# Patient Record
Sex: Male | Born: 1960 | Race: White | Hispanic: No | Marital: Married | Smoking: Never smoker
Health system: Southern US, Community
[De-identification: ages and names within clinical notes are randomized; demographics above are authoritative.]

## PROBLEM LIST (undated history)

## (undated) DIAGNOSIS — K429 Umbilical hernia without obstruction or gangrene: Secondary | ICD-10-CM

## (undated) DIAGNOSIS — E119 Type 2 diabetes mellitus without complications: Secondary | ICD-10-CM

## (undated) DIAGNOSIS — I10 Essential (primary) hypertension: Secondary | ICD-10-CM

## (undated) DIAGNOSIS — E785 Hyperlipidemia, unspecified: Secondary | ICD-10-CM

## (undated) DIAGNOSIS — M1990 Unspecified osteoarthritis, unspecified site: Secondary | ICD-10-CM

## (undated) HISTORY — PX: HERNIA REPAIR: SHX51

## (undated) HISTORY — PX: SHOULDER SURGERY: SHX246

## (undated) HISTORY — PX: LEG SURGERY: SHX1003

---

## 1990-09-29 DIAGNOSIS — I82409 Acute embolism and thrombosis of unspecified deep veins of unspecified lower extremity: Secondary | ICD-10-CM

## 1990-09-29 DIAGNOSIS — Z9289 Personal history of other medical treatment: Secondary | ICD-10-CM

## 1990-09-29 HISTORY — DX: Acute embolism and thrombosis of unspecified deep veins of unspecified lower extremity: I82.409

## 1990-09-29 HISTORY — DX: Personal history of other medical treatment: Z92.89

## 2007-11-14 ENCOUNTER — Ambulatory Visit: Payer: Self-pay | Admitting: Family Medicine

## 2008-07-13 ENCOUNTER — Ambulatory Visit: Payer: Self-pay | Admitting: Internal Medicine

## 2008-10-30 ENCOUNTER — Ambulatory Visit: Payer: Self-pay | Admitting: Internal Medicine

## 2009-03-21 ENCOUNTER — Ambulatory Visit: Payer: Self-pay | Admitting: Internal Medicine

## 2009-08-13 ENCOUNTER — Emergency Department: Payer: Self-pay | Admitting: Emergency Medicine

## 2009-08-29 ENCOUNTER — Ambulatory Visit: Payer: Self-pay | Admitting: Internal Medicine

## 2009-09-03 ENCOUNTER — Other Ambulatory Visit: Payer: Self-pay | Admitting: Internal Medicine

## 2009-09-29 ENCOUNTER — Ambulatory Visit: Payer: Self-pay | Admitting: Internal Medicine

## 2009-10-16 ENCOUNTER — Ambulatory Visit: Payer: Self-pay | Admitting: Unknown Physician Specialty

## 2009-10-30 ENCOUNTER — Ambulatory Visit: Payer: Self-pay | Admitting: Internal Medicine

## 2009-11-26 ENCOUNTER — Ambulatory Visit: Payer: Self-pay | Admitting: General Practice

## 2009-11-27 ENCOUNTER — Ambulatory Visit: Payer: Self-pay | Admitting: Internal Medicine

## 2009-12-14 ENCOUNTER — Inpatient Hospital Stay: Payer: Self-pay | Admitting: General Practice

## 2009-12-28 ENCOUNTER — Ambulatory Visit: Payer: Self-pay | Admitting: Internal Medicine

## 2010-01-27 ENCOUNTER — Ambulatory Visit: Payer: Self-pay | Admitting: Internal Medicine

## 2010-02-27 ENCOUNTER — Ambulatory Visit: Payer: Self-pay | Admitting: Internal Medicine

## 2010-03-15 ENCOUNTER — Ambulatory Visit: Payer: Self-pay | Admitting: Internal Medicine

## 2010-03-29 ENCOUNTER — Ambulatory Visit: Payer: Self-pay | Admitting: Internal Medicine

## 2010-04-29 ENCOUNTER — Ambulatory Visit: Payer: Self-pay | Admitting: Internal Medicine

## 2010-05-15 ENCOUNTER — Ambulatory Visit: Payer: Self-pay | Admitting: Internal Medicine

## 2010-05-26 IMAGING — CT CT CHEST-ABD-PELV W/ CM
1 of 2 series · 14 of 32 positions shown, 19 images · non-contrast
Comparison: none

REASON FOR EXAM: inguinal lymphadenopathy  DVT
COMMENTS:

[Series 2: soft tissue · axial · 0.86mm/px · z∈[-1194,-594]mm · 14 of 134 slices shown, 19 images]
[im 7/134  soft-tissue]
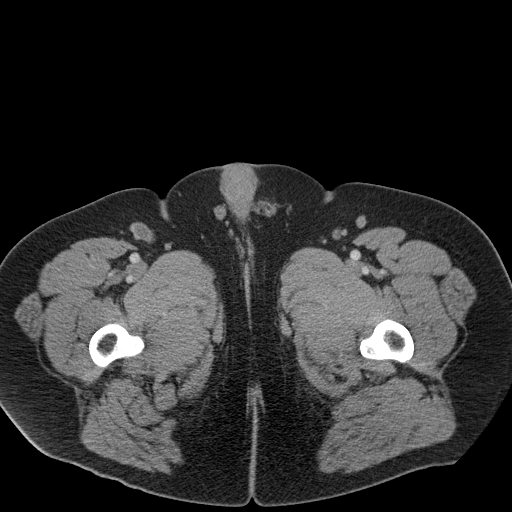
[im 7/134  bone]
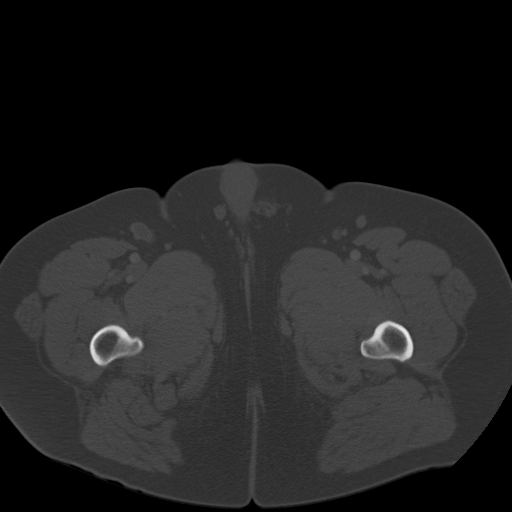
[im 20/134  soft-tissue]
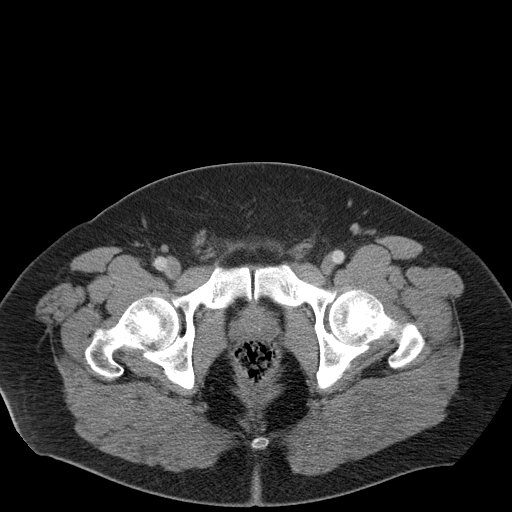
[im 26/134  soft-tissue]
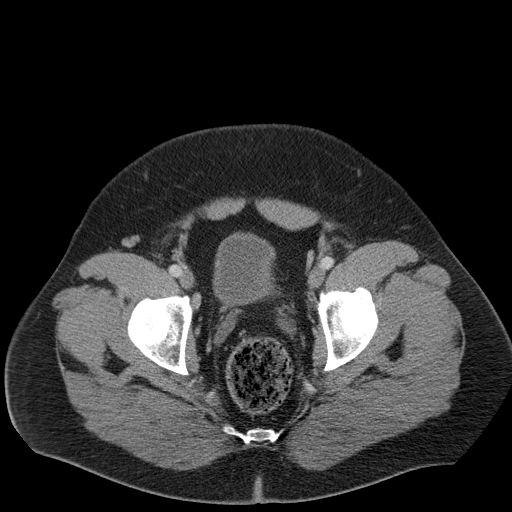
[im 39/134  soft-tissue]
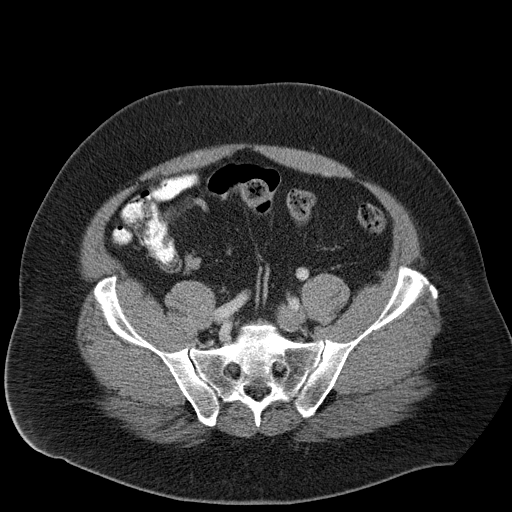
[im 45/134  soft-tissue]
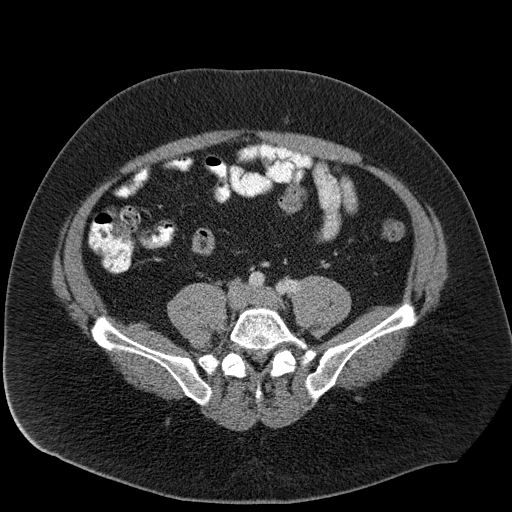
[im 58/134  soft-tissue]
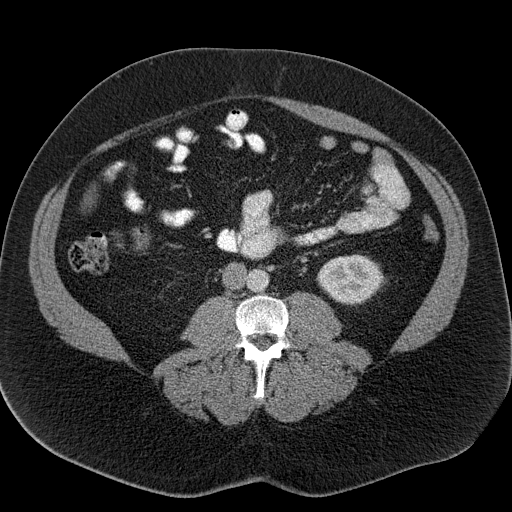
[im 70/134  soft-tissue]
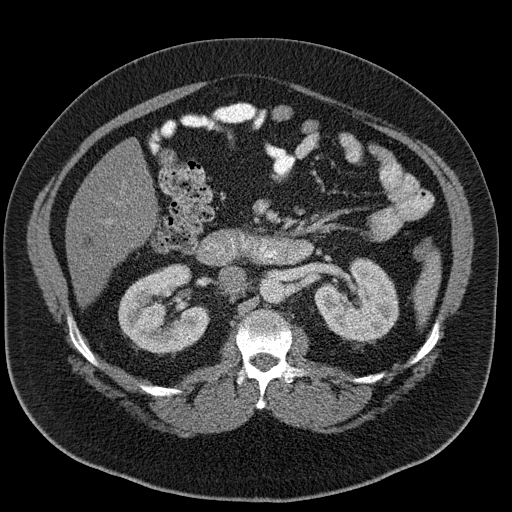
[im 77/134  soft-tissue]
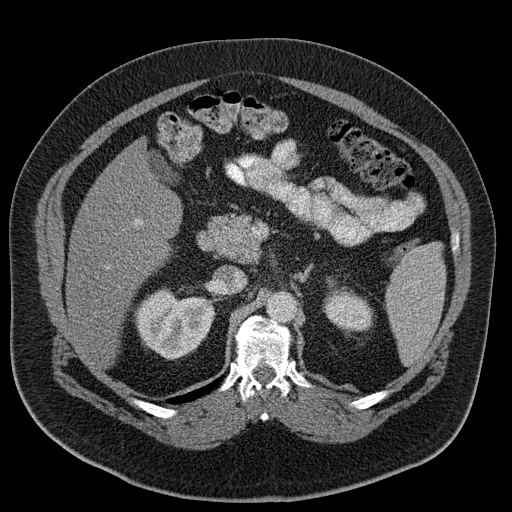
[im 89/134  soft-tissue]
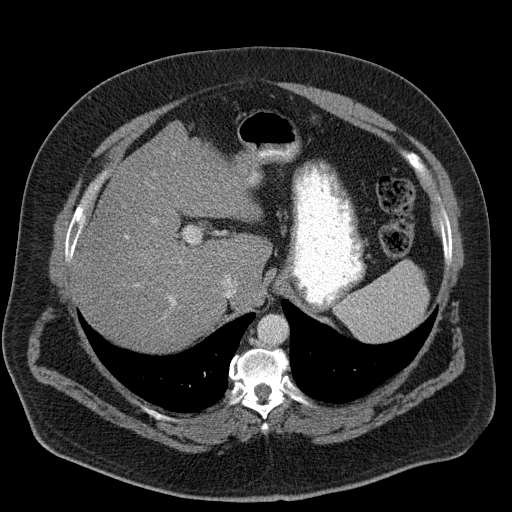
[im 89/134  bone]
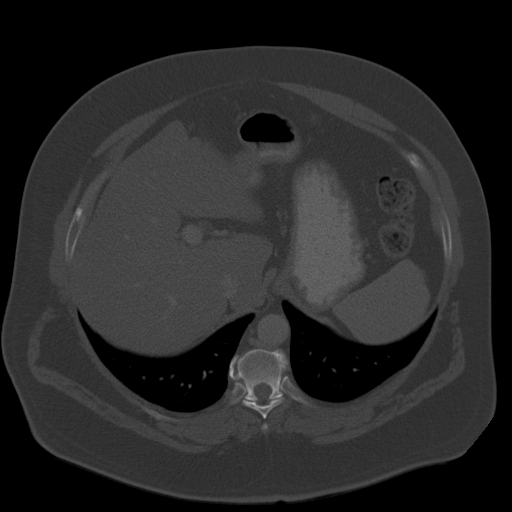
[im 96/134  soft-tissue]
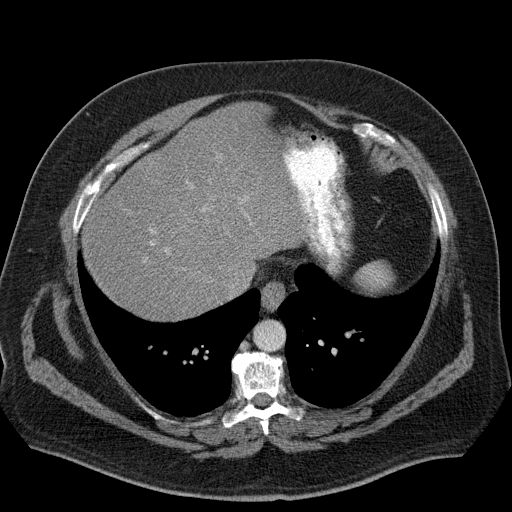
[im 108/134  soft-tissue]
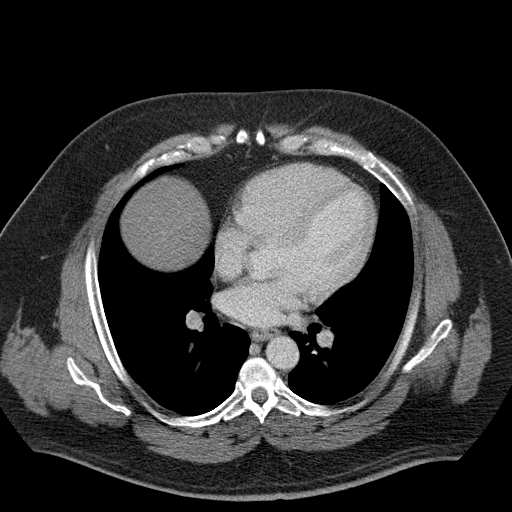
[im 108/134  lung]
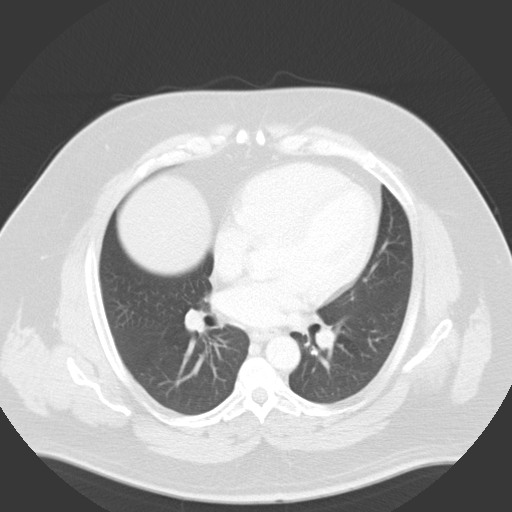
[im 115/134  soft-tissue]
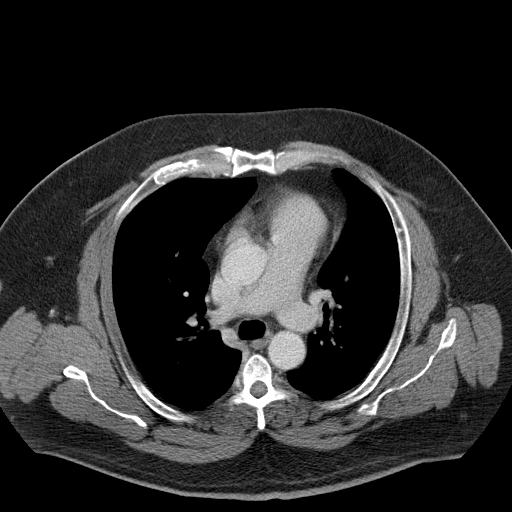
[im 115/134  lung]
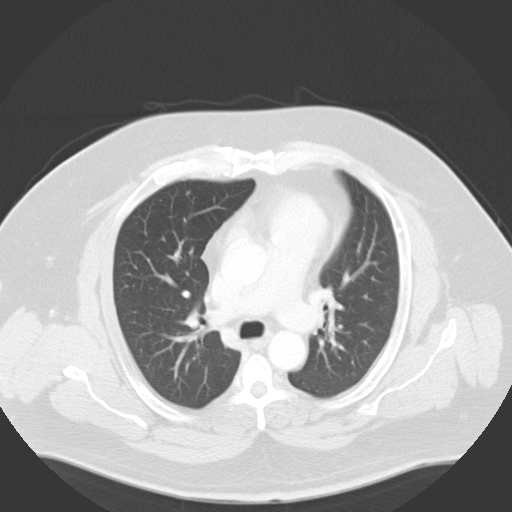
[im 121/134  lung]
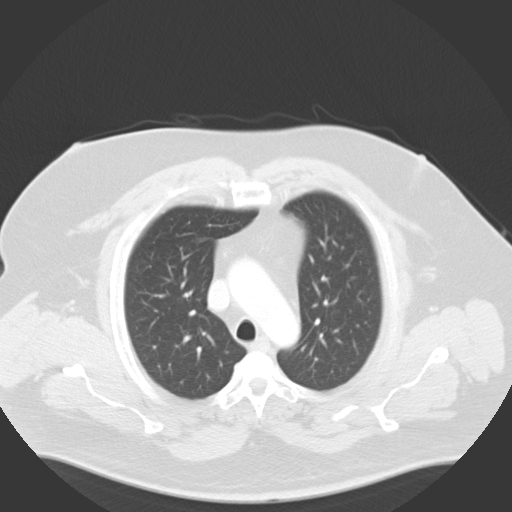
[im 127/134  soft-tissue]
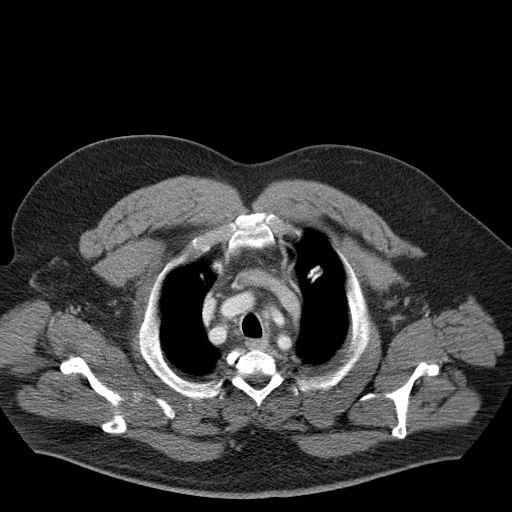
[im 127/134  lung]
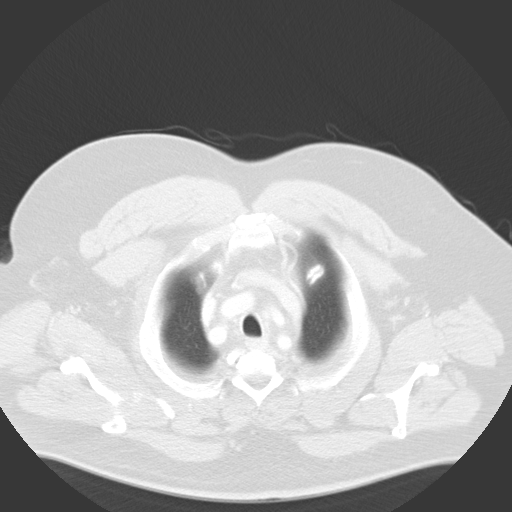

[14 of 32 positions shown; findings below may reference images not displayed]

PROCEDURE:     CT  - CT CHEST ABDOMEN AND PELVIS W  - September 04, 2009  [DATE]

RESULT:     CT of the chest, abdomen and pelvis is performed utilizing 100
ml of Msovue-S6L iodinated intravenous contrast along with oral contrast.
Images are reconstructed in the axial plane at 5 mm slice thickness. The
patient has no previous examination for comparison.

There is no evidence of supraclavicular, mediastinal, hilar or axillary mass
or adenopathy. The aorta is normal in caliber with no evidence of dissection
in the thoracic and aortic abdominal regions. There is low-attenuation of
the liver consistent with fatty infiltration. The esophagus appears
unremarkable. No central airway mass or obstruction is appreciated. There is
no focal pulmonary parenchymal nodule or mass. There is some minimal
posterior gutter atelectasis or fibrosis. This is seen on the right. There
is no evidence of retroperitoneal, mesenteric or porta hepatis adenopathy.
The spleen is not enlarged. There is no abnormal bowel distention or bowel
wall thickening. The gallbladder is present with no stones visualized. The
adrenal glands appear normal. The kidneys are unremarkable. There is no
edema. The appendix is normal. The urinary bladder is nondistended. Central
prostate calcifications are present. There is a moderate amount of fecal
material in the rectum and sigmoid region. There is no acute inflammation.
No pelvic mass is evident. No pelvic sidewall adenopathy is appreciated.
Some slightly prominent inguinal lymph nodes are seen with one on the right
measuring as much as 2.38 x 1.08 cm on image #129.
IMPRESSION: Some inguinal adenopathy is noted. Adenopathy is not
evident otherwise. There is fatty infiltration of the liver. No other
significant abnormality is seen.

## 2010-10-06 ENCOUNTER — Emergency Department: Payer: Self-pay | Admitting: Internal Medicine

## 2010-10-10 ENCOUNTER — Ambulatory Visit: Payer: Self-pay | Admitting: Family Medicine

## 2010-11-02 ENCOUNTER — Emergency Department: Payer: Self-pay | Admitting: Unknown Physician Specialty

## 2012-04-28 ENCOUNTER — Ambulatory Visit: Payer: Self-pay | Admitting: Family Medicine

## 2012-04-29 ENCOUNTER — Ambulatory Visit: Payer: Self-pay | Admitting: Family Medicine

## 2012-05-30 ENCOUNTER — Ambulatory Visit: Payer: Self-pay | Admitting: Family Medicine

## 2012-07-08 ENCOUNTER — Ambulatory Visit: Payer: Self-pay | Admitting: Family Medicine

## 2012-07-30 ENCOUNTER — Ambulatory Visit: Payer: Self-pay | Admitting: Family Medicine

## 2014-01-09 ENCOUNTER — Ambulatory Visit: Payer: Self-pay | Admitting: Physician Assistant

## 2014-09-08 ENCOUNTER — Emergency Department: Payer: Self-pay | Admitting: Student

## 2014-09-23 ENCOUNTER — Emergency Department: Payer: Self-pay | Admitting: Emergency Medicine

## 2016-08-18 ENCOUNTER — Emergency Department
Admission: EM | Admit: 2016-08-18 | Discharge: 2016-08-18 | Disposition: A | Discharge status: Home / Self Care | Payer: BC Managed Care – PPO | Attending: Emergency Medicine | Admitting: Emergency Medicine

## 2016-08-18 DIAGNOSIS — T148XXA Other injury of unspecified body region, initial encounter: Secondary | ICD-10-CM

## 2016-08-18 DIAGNOSIS — Y929 Unspecified place or not applicable: Secondary | ICD-10-CM | POA: Diagnosis not present

## 2016-08-18 DIAGNOSIS — S8991XA Unspecified injury of right lower leg, initial encounter: Secondary | ICD-10-CM | POA: Diagnosis present

## 2016-08-18 DIAGNOSIS — Y9301 Activity, walking, marching and hiking: Secondary | ICD-10-CM | POA: Diagnosis not present

## 2016-08-18 DIAGNOSIS — S86911A Strain of unspecified muscle(s) and tendon(s) at lower leg level, right leg, initial encounter: Secondary | ICD-10-CM | POA: Diagnosis not present

## 2016-08-18 DIAGNOSIS — W172XXA Fall into hole, initial encounter: Secondary | ICD-10-CM | POA: Insufficient documentation

## 2016-08-18 DIAGNOSIS — Y999 Unspecified external cause status: Secondary | ICD-10-CM | POA: Insufficient documentation

## 2016-08-18 NOTE — ED Provider Notes (Signed)
[facility] Emergency Department Provider Note  ____________________________________________   First MD Initiated Contact with Patient 08/18/16 0403     (approximate)  I have reviewed the triage vital signs and the nursing notes.   HISTORY  Chief Complaint Fall    HPI Manuel Fuller is a 55 y.o. male with hx of prior right sided knee surgery who presents with mild pain after stepping in a hole tonight while walking his dog.  States he "heard some pops" and had acute onset mild to moderate sharp pain mostly on the medial side of the knee.  No swelling.  Ambulatory without difficulty.  Thought he should get it checked out since he had surgery in the past.  Pain mild, slightly worse with ambulation but still able to walk without difficulty.  Also complains of mild dull aching pain in lower back, bilateral soft tissue at lower lumbar spine.     No past medical history on file.  There are no active problems to display for this patient.   No past surgical history on file.  Prior to Admission medications   Not on File    Allergies Patient has no known allergies.  No family history on file.  Social History Social History  Substance Use Topics  . Smoking status: Not on file  . Smokeless tobacco: Not on file  . Alcohol use Not on file    Review of Systems Constitutional: No fever/chills Eyes: No visual changes. CV:  No chest pain Gastrointestinal: No abdominal pain.  No nausea, no vomiting.  No diarrhea.   Genitourinary: Negative for dysuria. Musculoskeletal: Pain in right knee and dull ache in lower back Skin: Negative for rash.   ____________________________________________   PHYSICAL EXAM:  VITAL SIGNS: ED Triage Vitals  Enc Vitals Group     BP 08/18/16 0130 (!) 167/98     Pulse Rate 08/18/16 0130 98     Resp 08/18/16 0130 18     Temp 08/18/16 0130 98.2 F (36.8 C)     Temp Source 08/18/16 0130 Oral     SpO2 08/18/16 0130 99 %      Weight 08/18/16 0130 (!) 303 lb (137.4 kg)     Height 08/18/16 0130 5\' 7"  (1.702 m)     Head Circumference --      Peak Flow --      Pain Score 08/18/16 0131 8     Pain Loc --      Pain Edu? --      Excl. in GC? --     Constitutional: Alert and oriented. Well appearing and in no acute distress. Head: Atraumatic. Cardiovascular: Normal rate, regular rhythm. Good peripheral circulation.  Respiratory: Normal respiratory effort.  No retractions.  Musculoskeletal: No laxity of right knee joint.  No reproducible tenderness with laxity test.  No swelling around the joint.  Mild tenderness to palpation medial to the patella without effusion.  Old surgical scars are present.  No pain or tenderness with full flexion and extension of the joint.  Neurologic:  Normal speech and language. No gross focal neurologic deficits are appreciated.  Skin:  Skin is warm, dry and intact. No rash noted.  ____________________________________________   LABS (all labs ordered are listed, but only abnormal results are displayed)  Labs Reviewed - No data to display ____________________________________________  EKG  None - EKG not ordered by ED physician ____________________________________________  RADIOLOGY   No results found.  ____________________________________________   PROCEDURES  Procedure(s) performed:  Procedures   Critical Care performed: No ____________________________________________   INITIAL IMPRESSION / ASSESSMENT AND PLAN / ED COURSE  Pertinent labs & imaging results that were available during my care of the patient were reviewed by me and considered in my medical decision making (see chart for details).  The patient may have a mild strain in his right knee but has no evidence of severe injury.  There is no effusion, no swelling, no reproducible pain or tenderness with range of motion.  There is no laxity of the joint.  I explained to him that I could get x-rays but I do  not recommend that; [facility] and I encouraged him to follow up tomorrow and he is comfortable with that plan.  He is reassured by the normal physical exam and given that, as he pointed out, he can still walk on it, I think this is appropriate.  I will give him an Ace wrap and encourage close outpatient follow-up.  I explained that he should take Tylenol and ibuprofen for his low back strain.    ____________________________________________  FINAL CLINICAL IMPRESSION(S) / ED DIAGNOSES  Final diagnoses:  Knee strain, right, initial encounter  Muscle strain     MEDICATIONS GIVEN DURING THIS VISIT:  Medications - No data to display   NEW OUTPATIENT MEDICATIONS STARTED DURING THIS VISIT:  New Prescriptions   No medications on file    Modified Medications   No medications on file    Discontinued Medications   No medications on file     Note:  This document was prepared using Dragon voice recognition software and may include unintentional dictation errors.    Loleta Roseory Wilfrid Hyser, MD 08/18/16 [phone]

## 2016-08-18 NOTE — ED Notes (Addendum)
Head of bed lowered. Lights turned off per patient request

## 2016-08-18 NOTE — Discharge Instructions (Signed)

## 2016-08-18 NOTE — ED Notes (Signed)
Reviewed d/c instructions, follow-up care, use of ice/elevation, ace-bandage application and OTC pain meds with patient. Pt verbalized understanding.

## 2016-08-18 NOTE — ED Triage Notes (Signed)
Pt states that he walked his dog this afternoon, stepped in a mole hole and fell, pt is c/o lower back pain and rt knee pain
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
## 2016-12-12 ENCOUNTER — Other Ambulatory Visit: Payer: Self-pay | Admitting: Physical Medicine and Rehabilitation

## 2016-12-12 DIAGNOSIS — M5416 Radiculopathy, lumbar region: Secondary | ICD-10-CM

## 2016-12-24 ENCOUNTER — Ambulatory Visit
Admission: RE | Admit: 2016-12-24 | Discharge: 2016-12-24 | Disposition: A | Discharge status: Home / Self Care | Payer: BC Managed Care – PPO | Source: Ambulatory Visit | Attending: Physical Medicine and Rehabilitation | Admitting: Physical Medicine and Rehabilitation

## 2016-12-24 DIAGNOSIS — M4804 Spinal stenosis, thoracic region: Secondary | ICD-10-CM | POA: Insufficient documentation

## 2016-12-24 DIAGNOSIS — E882 Lipomatosis, not elsewhere classified: Secondary | ICD-10-CM | POA: Diagnosis not present

## 2016-12-24 DIAGNOSIS — M4316 Spondylolisthesis, lumbar region: Secondary | ICD-10-CM | POA: Diagnosis not present

## 2016-12-24 DIAGNOSIS — M48061 Spinal stenosis, lumbar region without neurogenic claudication: Secondary | ICD-10-CM | POA: Insufficient documentation

## 2016-12-24 DIAGNOSIS — M5416 Radiculopathy, lumbar region: Secondary | ICD-10-CM | POA: Diagnosis present

## 2016-12-24 DIAGNOSIS — M5127 Other intervertebral disc displacement, lumbosacral region: Secondary | ICD-10-CM | POA: Diagnosis not present

## 2017-11-03 ENCOUNTER — Emergency Department
Admission: EM | Admit: 2017-11-03 | Discharge: 2017-11-03 | Disposition: A | Discharge status: Home / Self Care | Payer: BC Managed Care – PPO | Attending: Emergency Medicine | Admitting: Emergency Medicine

## 2017-11-03 DIAGNOSIS — E119 Type 2 diabetes mellitus without complications: Secondary | ICD-10-CM | POA: Diagnosis not present

## 2017-11-03 DIAGNOSIS — Y929 Unspecified place or not applicable: Secondary | ICD-10-CM | POA: Diagnosis not present

## 2017-11-03 DIAGNOSIS — W57XXXA Bitten or stung by nonvenomous insect and other nonvenomous arthropods, initial encounter: Secondary | ICD-10-CM | POA: Diagnosis not present

## 2017-11-03 DIAGNOSIS — Y939 Activity, unspecified: Secondary | ICD-10-CM | POA: Diagnosis not present

## 2017-11-03 DIAGNOSIS — I10 Essential (primary) hypertension: Secondary | ICD-10-CM | POA: Insufficient documentation

## 2017-11-03 DIAGNOSIS — S20461A Insect bite (nonvenomous) of right back wall of thorax, initial encounter: Secondary | ICD-10-CM | POA: Insufficient documentation

## 2017-11-03 DIAGNOSIS — Y999 Unspecified external cause status: Secondary | ICD-10-CM | POA: Insufficient documentation

## 2017-11-03 DIAGNOSIS — S299XXA Unspecified injury of thorax, initial encounter: Secondary | ICD-10-CM | POA: Diagnosis present

## 2017-11-03 HISTORY — DX: Essential (primary) hypertension: I10

## 2017-11-03 HISTORY — DX: Hyperlipidemia, unspecified: E78.5

## 2017-11-03 HISTORY — DX: Type 2 diabetes mellitus without complications: E11.9

## 2017-11-03 MED ORDER — HYDROXYZINE HCL 50 MG PO TABS: 50.0000 mg | ORAL_TABLET | Freq: Three times a day (TID) | ORAL | 0 refills | Status: DC | PRN

## 2017-11-03 MED ORDER — HYDROXYZINE HCL 50 MG PO TABS
50.0000 mg | ORAL_TABLET | Freq: Once | ORAL | Status: AC
  Administered 2017-11-03: 50 mg via ORAL
  Filled 2017-11-03: qty 1
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
## 2017-11-03 NOTE — ED Triage Notes (Signed)
Pt reports that he was bitten by something on his back, but states that he is not sure what.  Pt reports that he went by FD before coming to ER, but was unable to tell what this was.  Pt is A&Ox4, in NAD.

## 2018-05-20 ENCOUNTER — Encounter: Payer: Self-pay | Admitting: Emergency Medicine

## 2018-05-20 ENCOUNTER — Emergency Department
Admission: EM | Admit: 2018-05-20 | Discharge: 2018-05-20 | Disposition: A | Discharge status: Home / Self Care | Payer: BC Managed Care – PPO | Attending: Emergency Medicine | Admitting: Emergency Medicine

## 2018-05-20 ENCOUNTER — Other Ambulatory Visit: Payer: Self-pay

## 2018-05-20 ENCOUNTER — Emergency Department: Payer: BC Managed Care – PPO

## 2018-05-20 DIAGNOSIS — Z79899 Other long term (current) drug therapy: Secondary | ICD-10-CM | POA: Insufficient documentation

## 2018-05-20 DIAGNOSIS — E785 Hyperlipidemia, unspecified: Secondary | ICD-10-CM | POA: Diagnosis not present

## 2018-05-20 DIAGNOSIS — R079 Chest pain, unspecified: Secondary | ICD-10-CM | POA: Insufficient documentation

## 2018-05-20 DIAGNOSIS — E119 Type 2 diabetes mellitus without complications: Secondary | ICD-10-CM | POA: Diagnosis not present

## 2018-05-20 DIAGNOSIS — I10 Essential (primary) hypertension: Secondary | ICD-10-CM | POA: Insufficient documentation

## 2018-05-20 LAB — BASIC METABOLIC PANEL
ANION GAP: 11 (ref 5–15)
BUN: 16 mg/dL (ref 6–20)
CO2: 24 mmol/L (ref 22–32)
Calcium: 8.7 mg/dL — ABNORMAL LOW (ref 8.9–10.3)
Chloride: 104 mmol/L (ref 98–111)
Creatinine, Ser: 0.87 mg/dL (ref 0.61–1.24)
GFR calc Af Amer: >60 mL/min (ref >60)
GFR calc non Af Amer: >60 mL/min (ref >60)
GLUCOSE: 118 mg/dL — AB (ref 70–99)
POTASSIUM: 3.4 mmol/L — AB (ref 3.5–5.1)
Sodium: 139 mmol/L (ref 135–145)

## 2018-05-20 LAB — CBC
HEMATOCRIT: 42.4 % (ref 40.0–52.0)
Hemoglobin: 14.3 g/dL (ref 13.0–18.0)
MCH: 30.3 pg (ref 26.0–34.0)
MCHC: 33.8 g/dL (ref 32.0–36.0)
MCV: 89.7 fL (ref 80.0–100.0)
PLATELETS: 204 10*3/uL (ref 150–440)
RBC: 4.73 MIL/uL (ref 4.40–5.90)
RDW: 13.6 % (ref 11.5–14.5)
WBC: 7.7 10*3/uL (ref 3.8–10.6)

## 2018-05-20 LAB — TROPONIN I
Troponin I: <0.03 ng/mL (ref <0.03)
Troponin I: <0.03 ng/mL (ref <0.03)

## 2018-05-20 NOTE — ED Notes (Signed)
Pt ambulatory upon discharge; declined wheel chair. Verbalized understanding of discharge instructions and follow-up care. VSS. Skin warm and dry. A&O x4.  

## 2018-05-20 NOTE — ED Provider Notes (Signed)
[facility] Emergency Department Provider Note  ____________________________________________   First MD Initiated Contact with Patient 05/20/18 [phone]     (approximate)  I have reviewed the triage vital signs and the nursing notes.   HISTORY  Chief Complaint Chest Pain   HPI Manuel Fuller is a 57 y.o. male self presents to the emergency department with several days of intermittent left upper chest pain.  The pain is sharp mild to moderate nonradiating.  It lasts several minutes at a time.  No shortness of breath.  No nausea or vomiting.  He has no coronary history although he does have a history of diabetes, hypertension, dyslipidemia.  He has no recent surgery travel or immobilization.  No leg swelling.  The pain is not ripping or tearing it does not go straight to his back.  He is concerned because his pain persists and he wanted to be checked out for a "heart attack".    Past Medical History:  Diagnosis Date  . Diabetes mellitus without complication (HCC)   . Hyperlipemia   . Hypertension     There are no active problems to display for this patient.   Past Surgical History:  Procedure Laterality Date  . HERNIA REPAIR    . LEG SURGERY    . SHOULDER SURGERY      Prior to Admission medications   Medication Sig Start Date End Date Taking? Authorizing Provider  hydrOXYzine (ATARAX/VISTARIL) 50 MG tablet Take 1 tablet (50 mg total) by mouth 3 (three) times daily as needed for itching. 11/03/17   Joni ReiningSmith, Ronald K, PA-C    Allergies Patient has no known allergies.  No family history on file.  Social History Social History   Tobacco Use  . Smoking status: Never Smoker  . Smokeless tobacco: Never Used  Substance Use Topics  . Alcohol use: No    Frequency: Never  . Drug use: No    Review of Systems Constitutional: No fever/chills Eyes: No visual changes. ENT: No sore throat. Cardiovascular: Positive for chest pain. Respiratory: Denies  shortness of breath. Gastrointestinal: No abdominal pain.  No nausea, no vomiting.  No diarrhea.  No constipation. Genitourinary: Negative for dysuria. Musculoskeletal: Negative for back pain. Skin: Negative for rash. Neurological: Negative for headaches, focal weakness or numbness.   ____________________________________________   PHYSICAL EXAM:  VITAL SIGNS: ED Triage Vitals  Enc Vitals Group     BP 05/20/18 0605 (!) 135/91     Pulse Rate 05/20/18 0605 73     Resp 05/20/18 0605 18     Temp 05/20/18 0605 98 F (36.7 C)     Temp Source 05/20/18 0605 Oral     SpO2 05/20/18 0605 99 %     Weight 05/20/18 0603 (!) 310 lb (140.6 kg)     Height 05/20/18 0603 5\' 7"  (1.702 m)     Head Circumference --      Peak Flow --      Pain Score 05/20/18 0603 0     Pain Loc --      Pain Edu? --      Excl. in GC? --     Constitutional: Alert and oriented x4 pleasant cooperative speaks full clear sentences Eyes: PERRL EOMI. Head: Atraumatic. Nose: No congestion/rhinnorhea. Mouth/Throat: No trismus Neck: No stridor.   Cardiovascular: Normal rate, regular rhythm. Grossly normal heart sounds.  Good peripheral circulation. Respiratory: Normal respiratory effort.  No retractions. Lungs CTAB and moving good air Gastrointestinal: Morbidly obese soft nontender  Musculoskeletal: No lower extremity edema   Neurologic:  Normal speech and language. No gross focal neurologic deficits are appreciated. Skin:  Skin is warm, dry and intact. No rash noted. Psychiatric: Mood and affect are normal. Speech and behavior are normal.    ____________________________________________   DIFFERENTIAL includes but not limited to  Aortic dissection, acute coronary syndrome, stable angina, pulmonary embolism ____________________________________________   LABS (all labs ordered are listed, but only abnormal results are displayed)  Labs Reviewed  BASIC METABOLIC PANEL - Abnormal; Notable for the following  components:      Result Value   Potassium 3.4 (*)    Glucose, Bld 118 (*)    Calcium 8.7 (*)    All other components within normal limits  CBC  TROPONIN I  TROPONIN I    Lab work reviewed by me with no signs of acute ischemia x2 __________________________________________  EKG  ED ECG REPORT I, Merrily Brittle, the attending physician, personally viewed and interpreted this ECG.  Date: 05/22/2018 EKG Time:  Rate: 69 Rhythm: normal sinus rhythm QRS Axis: normal Intervals: normal ST/T Wave abnormalities: normal Narrative Interpretation: no evidence of acute ischemia  ____________________________________________  RADIOLOGY  Chest x-ray reviewed by me with no acute disease ____________________________________________   PROCEDURES  Procedure(s) performed: no  Procedures  Critical Care performed: no  ____________________________________________   INITIAL IMPRESSION / ASSESSMENT AND PLAN / ED COURSE  Pertinent labs & imaging results that were available during my care of the patient were reviewed by me and considered in my medical decision making (see chart for details).   As part of my medical decision making, I reviewed the following data within the electronic MEDICAL RECORD NUMBER History obtained from family if available, nursing notes, old chart and ekg, as well as notes from prior ED visits.  The patient arrives with no chest pain and his history is largely atypical.  He is middle-aged, morbidly obese, hypertensive diabetic with dyslipidemia which does raise concern for possible cardiac disease.  First troponin is negative but I do believe he requires at least one more.  Second troponin is fortunately negative.  At this point there is no indication for inpatient admission however the patient would benefit from cardiology referral and risk stratification.  Strict return precautions have been given.      ____________________________________________   FINAL CLINICAL  IMPRESSION(S) / ED DIAGNOSES  Final diagnoses:  Chest pain, unspecified type      NEW MEDICATIONS STARTED DURING THIS VISIT:  Discharge Medication List as of 05/20/2018 10:41 AM       Note:  This document was prepared using Dragon voice recognition software and may include unintentional dictation errors.     Merrily Brittle, MD 05/22/18 2223

## 2018-05-20 NOTE — ED Triage Notes (Signed)
Pt ambulatory to room 3 without difficulty or distress noted; pt reports for several days has had intermittent left upper CP, nonradiating with no accomp symptoms; denies hx of same; denies pain at present

## 2018-05-20 NOTE — Discharge Instructions (Signed)
Fortunately today your EKG, your chest x-ray, and your blood work were reassuring.  Please make an appointment to follow-up with the cardiologist this coming Monday for reevaluation and return to the emergency department for any concerns.  It was a pleasure to take care of you today, and thank you for coming to our emergency department.  If you have any questions or concerns before leaving please ask the nurse to grab me and I'm more than happy to go through your aftercare instructions again.  If you were prescribed any opioid pain medication today such as Norco, Vicodin, Percocet, morphine, hydrocodone, or oxycodone please make sure you do not drive when you are taking this medication as it can alter your ability to drive safely.  If you have any concerns once you are home that you are not improving or are in fact getting worse before you can make it to your follow-up appointment, please do not hesitate to call 911 and come back for further evaluation.  Merrily BrittleNeil Vanetta Rule, MD  Results for orders placed or performed during the hospital encounter of 05/20/18  CBC  Result Value Ref Range   WBC 7.7 3.8 - 10.6 K/uL   RBC 4.73 4.40 - 5.90 MIL/uL   Hemoglobin 14.3 13.0 - 18.0 g/dL   HCT 40.942.4 81.140.0 - 91.452.0 %   MCV 89.7 80.0 - 100.0 fL   MCH 30.3 26.0 - 34.0 pg   MCHC 33.8 32.0 - 36.0 g/dL   RDW 78.213.6 95.611.5 - 21.314.5 %   Platelets 204 150 - 440 K/uL  Basic metabolic panel  Result Value Ref Range   Sodium 139 135 - 145 mmol/L   Potassium 3.4 (L) 3.5 - 5.1 mmol/L   Chloride 104 98 - 111 mmol/L   CO2 24 22 - 32 mmol/L   Glucose, Bld 118 (H) 70 - 99 mg/dL   BUN 16 6 - 20 mg/dL   Creatinine, Ser 0.860.87 0.61 - 1.24 mg/dL   Calcium 8.7 (L) 8.9 - 10.3 mg/dL   GFR calc non Af Amer >60 >60 mL/min   GFR calc Af Amer >60 >60 mL/min   Anion gap 11 5 - 15  Troponin I  Result Value Ref Range   Troponin I <0.03 <0.03 ng/mL  Troponin I  Result Value Ref Range   Troponin I <0.03 <0.03 ng/mL   Dg Chest Port 1  View  Result Date: 05/20/2018 CLINICAL DATA:  Chest pain. EXAM: PORTABLE CHEST 1 VIEW COMPARISON:  CT 09/04/2009. FINDINGS: Mediastinum and hilar structures normal. Stable cardiomegaly with normal pulmonary vascularity. No focal infiltrate. No pleural effusion or pneumothorax. Left shoulder replacement. IMPRESSION: State cardiomegaly. No pulmonary venous congestion. No acute pulmonary abnormality identified. Electronically Signed   By: Maisie Fushomas  Register   On: 05/20/2018 06:40

## 2019-07-08 ENCOUNTER — Other Ambulatory Visit: Admission: RE | Admit: 2019-07-08 | Payer: BC Managed Care – PPO | Source: Ambulatory Visit

## 2019-07-12 ENCOUNTER — Encounter: Admission: RE | Payer: Self-pay | Source: Home / Self Care

## 2019-07-12 ENCOUNTER — Ambulatory Visit
Admission: RE | Admit: 2019-07-12 | Payer: BC Managed Care – PPO | Source: Home / Self Care | Admitting: Internal Medicine

## 2019-07-12 SURGERY — COLONOSCOPY WITH PROPOFOL
Anesthesia: General

## 2019-11-24 ENCOUNTER — Other Ambulatory Visit: Payer: Self-pay | Admitting: Neurosurgery

## 2019-11-24 DIAGNOSIS — M4316 Spondylolisthesis, lumbar region: Secondary | ICD-10-CM

## 2019-12-01 ENCOUNTER — Ambulatory Visit
Admission: RE | Admit: 2019-12-01 | Discharge: 2019-12-01 | Disposition: A | Discharge status: Home / Self Care | Payer: BC Managed Care – PPO | Source: Ambulatory Visit | Attending: Neurosurgery | Admitting: Neurosurgery

## 2019-12-01 ENCOUNTER — Other Ambulatory Visit: Payer: Self-pay

## 2019-12-01 DIAGNOSIS — M4316 Spondylolisthesis, lumbar region: Secondary | ICD-10-CM | POA: Insufficient documentation

## 2019-12-07 ENCOUNTER — Other Ambulatory Visit: Payer: Self-pay | Admitting: Neurosurgery

## 2019-12-28 ENCOUNTER — Inpatient Hospital Stay: Admit: 2019-12-28 | Payer: BC Managed Care – PPO | Admitting: Neurosurgery

## 2019-12-28 SURGERY — POSTERIOR LUMBAR FUSION 1 LEVEL
Anesthesia: General

## 2020-03-25 ENCOUNTER — Emergency Department (HOSPITAL_COMMUNITY): Payer: BC Managed Care – PPO

## 2020-03-25 ENCOUNTER — Other Ambulatory Visit: Payer: Self-pay

## 2020-03-25 ENCOUNTER — Inpatient Hospital Stay (HOSPITAL_COMMUNITY)
Admission: EM | Admit: 2020-03-25 | Discharge: 2020-03-29 | DRG: 982 | Disposition: A | Discharge status: Rehabilitation Facility | Payer: BC Managed Care – PPO | Attending: General Surgery | Admitting: General Surgery

## 2020-03-25 ENCOUNTER — Encounter (HOSPITAL_COMMUNITY): Payer: Self-pay | Admitting: Emergency Medicine

## 2020-03-25 DIAGNOSIS — K760 Fatty (change of) liver, not elsewhere classified: Secondary | ICD-10-CM | POA: Diagnosis present

## 2020-03-25 DIAGNOSIS — Z20822 Contact with and (suspected) exposure to covid-19: Secondary | ICD-10-CM | POA: Diagnosis present

## 2020-03-25 DIAGNOSIS — S93325S Dislocation of tarsometatarsal joint of left foot, sequela: Secondary | ICD-10-CM | POA: Diagnosis not present

## 2020-03-25 DIAGNOSIS — G8918 Other acute postprocedural pain: Secondary | ICD-10-CM | POA: Diagnosis not present

## 2020-03-25 DIAGNOSIS — Z79899 Other long term (current) drug therapy: Secondary | ICD-10-CM | POA: Diagnosis not present

## 2020-03-25 DIAGNOSIS — K5903 Drug induced constipation: Secondary | ICD-10-CM | POA: Diagnosis not present

## 2020-03-25 DIAGNOSIS — D62 Acute posthemorrhagic anemia: Secondary | ICD-10-CM | POA: Diagnosis present

## 2020-03-25 DIAGNOSIS — E8889 Other specified metabolic disorders: Secondary | ICD-10-CM | POA: Diagnosis present

## 2020-03-25 DIAGNOSIS — E785 Hyperlipidemia, unspecified: Secondary | ICD-10-CM | POA: Diagnosis present

## 2020-03-25 DIAGNOSIS — E119 Type 2 diabetes mellitus without complications: Secondary | ICD-10-CM | POA: Diagnosis present

## 2020-03-25 DIAGNOSIS — S92312A Displaced fracture of first metatarsal bone, left foot, initial encounter for closed fracture: Secondary | ICD-10-CM | POA: Diagnosis present

## 2020-03-25 DIAGNOSIS — M546 Pain in thoracic spine: Secondary | ICD-10-CM | POA: Diagnosis present

## 2020-03-25 DIAGNOSIS — S0081XA Abrasion of other part of head, initial encounter: Secondary | ICD-10-CM | POA: Diagnosis present

## 2020-03-25 DIAGNOSIS — S92302A Fracture of unspecified metatarsal bone(s), left foot, initial encounter for closed fracture: Secondary | ICD-10-CM

## 2020-03-25 DIAGNOSIS — Z86718 Personal history of other venous thrombosis and embolism: Secondary | ICD-10-CM

## 2020-03-25 DIAGNOSIS — Z7984 Long term (current) use of oral hypoglycemic drugs: Secondary | ICD-10-CM | POA: Diagnosis not present

## 2020-03-25 DIAGNOSIS — S92332A Displaced fracture of third metatarsal bone, left foot, initial encounter for closed fracture: Secondary | ICD-10-CM | POA: Diagnosis present

## 2020-03-25 DIAGNOSIS — M5432 Sciatica, left side: Secondary | ICD-10-CM | POA: Diagnosis present

## 2020-03-25 DIAGNOSIS — E669 Obesity, unspecified: Secondary | ICD-10-CM | POA: Diagnosis not present

## 2020-03-25 DIAGNOSIS — S93325A Dislocation of tarsometatarsal joint of left foot, initial encounter: Secondary | ICD-10-CM | POA: Diagnosis not present

## 2020-03-25 DIAGNOSIS — I10 Essential (primary) hypertension: Secondary | ICD-10-CM | POA: Diagnosis present

## 2020-03-25 DIAGNOSIS — S92322A Displaced fracture of second metatarsal bone, left foot, initial encounter for closed fracture: Secondary | ICD-10-CM | POA: Diagnosis present

## 2020-03-25 DIAGNOSIS — Z6841 Body Mass Index (BMI) 40.0 and over, adult: Secondary | ICD-10-CM

## 2020-03-25 DIAGNOSIS — R0683 Snoring: Secondary | ICD-10-CM | POA: Diagnosis present

## 2020-03-25 DIAGNOSIS — S2242XA Multiple fractures of ribs, left side, initial encounter for closed fracture: Secondary | ICD-10-CM | POA: Diagnosis present

## 2020-03-25 DIAGNOSIS — E1169 Type 2 diabetes mellitus with other specified complication: Secondary | ICD-10-CM | POA: Diagnosis not present

## 2020-03-25 DIAGNOSIS — S2221XA Fracture of manubrium, initial encounter for closed fracture: Secondary | ICD-10-CM | POA: Diagnosis present

## 2020-03-25 DIAGNOSIS — S2220XS Unspecified fracture of sternum, sequela: Secondary | ICD-10-CM | POA: Diagnosis not present

## 2020-03-25 DIAGNOSIS — K449 Diaphragmatic hernia without obstruction or gangrene: Secondary | ICD-10-CM | POA: Diagnosis present

## 2020-03-25 DIAGNOSIS — E1165 Type 2 diabetes mellitus with hyperglycemia: Secondary | ICD-10-CM | POA: Diagnosis not present

## 2020-03-25 DIAGNOSIS — T148XXA Other injury of unspecified body region, initial encounter: Secondary | ICD-10-CM

## 2020-03-25 DIAGNOSIS — Z419 Encounter for procedure for purposes other than remedying health state, unspecified: Secondary | ICD-10-CM

## 2020-03-25 DIAGNOSIS — K5900 Constipation, unspecified: Secondary | ICD-10-CM | POA: Diagnosis not present

## 2020-03-25 DIAGNOSIS — G8911 Acute pain due to trauma: Secondary | ICD-10-CM | POA: Diagnosis present

## 2020-03-25 LAB — COMPREHENSIVE METABOLIC PANEL
ALT: 46 U/L — ABNORMAL HIGH (ref 0–44)
AST: 46 U/L — ABNORMAL HIGH (ref 15–41)
Albumin: 4.7 g/dL (ref 3.5–5.0)
Alkaline Phosphatase: 44 U/L (ref 38–126)
Anion gap: 15 (ref 5–15)
BUN: 23 mg/dL — ABNORMAL HIGH (ref 6–20)
CO2: 21 mmol/L — ABNORMAL LOW (ref 22–32)
Calcium: 9.3 mg/dL (ref 8.9–10.3)
Chloride: 104 mmol/L (ref 98–111)
Creatinine, Ser: 1.16 mg/dL (ref 0.61–1.24)
GFR calc Af Amer: >60 mL/min (ref >60)
GFR calc non Af Amer: >60 mL/min (ref >60)
Glucose, Bld: 177 mg/dL — ABNORMAL HIGH (ref 70–99)
Potassium: 3.7 mmol/L (ref 3.5–5.1)
Sodium: 140 mmol/L (ref 135–145)
Total Bilirubin: 0.8 mg/dL (ref 0.3–1.2)
Total Protein: 7.8 g/dL (ref 6.5–8.1)

## 2020-03-25 LAB — TYPE AND SCREEN
ABO/RH(D): O POS
ABO/RH(D): O POS
Antibody Screen: NEGATIVE
Antibody Screen: NEGATIVE

## 2020-03-25 LAB — CBC WITH DIFFERENTIAL/PLATELET
Abs Immature Granulocytes: 0.18 10*3/uL — ABNORMAL HIGH (ref 0.00–0.07)
Basophils Absolute: 0.1 10*3/uL (ref 0.0–0.1)
Basophils Relative: 0 %
Eosinophils Absolute: 0.1 10*3/uL (ref 0.0–0.5)
Eosinophils Relative: 1 %
HCT: 45.6 % (ref 39.0–52.0)
Hemoglobin: 14.9 g/dL (ref 13.0–17.0)
Immature Granulocytes: 1 %
Lymphocytes Relative: 10 %
Lymphs Abs: 1.5 10*3/uL (ref 0.7–4.0)
MCH: 29.9 pg (ref 26.0–34.0)
MCHC: 32.7 g/dL (ref 30.0–36.0)
MCV: 91.4 fL (ref 80.0–100.0)
Monocytes Absolute: 0.8 10*3/uL (ref 0.1–1.0)
Monocytes Relative: 6 %
Neutro Abs: 11.7 10*3/uL — ABNORMAL HIGH (ref 1.7–7.7)
Neutrophils Relative %: 82 %
Platelets: 294 10*3/uL (ref 150–400)
RBC: 4.99 MIL/uL (ref 4.22–5.81)
RDW: 13.4 % (ref 11.5–15.5)
WBC: 14.2 10*3/uL — ABNORMAL HIGH (ref 4.0–10.5)
nRBC: 0 % (ref 0.0–0.2)

## 2020-03-25 LAB — CBG MONITORING, ED: Glucose-Capillary: 162 mg/dL — ABNORMAL HIGH (ref 70–99)

## 2020-03-25 LAB — LACTIC ACID, PLASMA: Lactic Acid, Venous: 2.3 mmol/L (ref 0.5–1.9)

## 2020-03-25 LAB — SARS CORONAVIRUS 2 BY RT PCR (HOSPITAL ORDER, PERFORMED IN ~~LOC~~ HOSPITAL LAB): SARS Coronavirus 2: NEGATIVE

## 2020-03-25 LAB — HEMOGLOBIN A1C
Hgb A1c MFr Bld: 6.6 % — ABNORMAL HIGH (ref 4.8–5.6)
Mean Plasma Glucose: 142.72 mg/dL

## 2020-03-25 LAB — ABO/RH: ABO/RH(D): O POS

## 2020-03-25 MED ORDER — ENOXAPARIN SODIUM 40 MG/0.4ML ~~LOC~~ SOLN
40.0000 mg | Freq: Two times a day (BID) | SUBCUTANEOUS | Status: DC
  Administered 2020-03-25 – 2020-03-29 (×7): 40 mg via SUBCUTANEOUS
  Filled 2020-03-25 (×7): qty 0.4

## 2020-03-25 MED ORDER — INSULIN ASPART 100 UNIT/ML ~~LOC~~ SOLN
0.0000 [IU] | Freq: Three times a day (TID) | SUBCUTANEOUS | Status: DC
  Administered 2020-03-26 (×2): 4 [IU] via SUBCUTANEOUS
  Administered 2020-03-26: 3 [IU] via SUBCUTANEOUS
  Administered 2020-03-27: 7 [IU] via SUBCUTANEOUS
  Administered 2020-03-28 (×2): 4 [IU] via SUBCUTANEOUS
  Administered 2020-03-28: 3 [IU] via SUBCUTANEOUS
  Administered 2020-03-29: 4 [IU] via SUBCUTANEOUS
  Administered 2020-03-29 (×2): 3 [IU] via SUBCUTANEOUS

## 2020-03-25 MED ORDER — ENOXAPARIN SODIUM 40 MG/0.4ML ~~LOC~~ SOLN: 40.0000 mg | SUBCUTANEOUS | Status: DC

## 2020-03-25 MED ORDER — DOCUSATE SODIUM 100 MG PO CAPS
100.0000 mg | ORAL_CAPSULE | Freq: Two times a day (BID) | ORAL | Status: DC
  Administered 2020-03-26 – 2020-03-29 (×7): 100 mg via ORAL
  Filled 2020-03-25 (×7): qty 1

## 2020-03-25 MED ORDER — IOHEXOL 300 MG/ML  SOLN
100.0000 mL | Freq: Once | INTRAMUSCULAR | Status: AC | PRN
  Administered 2020-03-25: 100 mL via INTRAVENOUS

## 2020-03-25 MED ORDER — ONDANSETRON HCL 4 MG/2ML IJ SOLN: 4.0000 mg | Freq: Four times a day (QID) | INTRAMUSCULAR | Status: DC | PRN

## 2020-03-25 MED ORDER — HYDROCHLOROTHIAZIDE 25 MG PO TABS
25.0000 mg | ORAL_TABLET | Freq: Every day | ORAL | Status: DC
  Administered 2020-03-26 – 2020-03-29 (×4): 25 mg via ORAL
  Filled 2020-03-25 (×4): qty 1

## 2020-03-25 MED ORDER — ONDANSETRON 4 MG PO TBDP: 4.0000 mg | ORAL_TABLET | Freq: Four times a day (QID) | ORAL | Status: DC | PRN

## 2020-03-25 MED ORDER — METHOCARBAMOL 500 MG PO TABS
500.0000 mg | ORAL_TABLET | Freq: Three times a day (TID) | ORAL | Status: DC | PRN
  Administered 2020-03-26: 500 mg via ORAL
  Filled 2020-03-25 (×2): qty 1

## 2020-03-25 MED ORDER — PANTOPRAZOLE SODIUM 40 MG PO TBEC
40.0000 mg | DELAYED_RELEASE_TABLET | Freq: Every day | ORAL | Status: DC
  Administered 2020-03-26 – 2020-03-27 (×2): 40 mg via ORAL
  Filled 2020-03-25 (×3): qty 1

## 2020-03-25 MED ORDER — MORPHINE SULFATE (PF) 2 MG/ML IV SOLN
1.0000 mg | INTRAVENOUS | Status: DC | PRN
  Administered 2020-03-25 – 2020-03-26 (×2): 2 mg via INTRAVENOUS
  Filled 2020-03-25 (×2): qty 1

## 2020-03-25 MED ORDER — ACETAMINOPHEN 500 MG PO TABS
1000.0000 mg | ORAL_TABLET | Freq: Three times a day (TID) | ORAL | Status: DC
  Administered 2020-03-25 – 2020-03-29 (×11): 1000 mg via ORAL
  Filled 2020-03-25 (×11): qty 2

## 2020-03-25 MED ORDER — FENTANYL CITRATE (PF) 100 MCG/2ML IJ SOLN
50.0000 ug | Freq: Once | INTRAMUSCULAR | Status: AC
  Administered 2020-03-25: 50 ug via INTRAVENOUS
  Filled 2020-03-25: qty 2

## 2020-03-25 MED ORDER — LISINOPRIL 20 MG PO TABS
20.0000 mg | ORAL_TABLET | Freq: Every day | ORAL | Status: DC
  Administered 2020-03-26 – 2020-03-29 (×4): 20 mg via ORAL
  Filled 2020-03-25 (×4): qty 1

## 2020-03-25 MED ORDER — GABAPENTIN 100 MG PO CAPS
200.0000 mg | ORAL_CAPSULE | Freq: Three times a day (TID) | ORAL | Status: DC
  Administered 2020-03-26 – 2020-03-29 (×10): 200 mg via ORAL
  Filled 2020-03-25 (×13): qty 2

## 2020-03-25 MED ORDER — SODIUM CHLORIDE 0.9 % IV BOLUS
1000.0000 mL | Freq: Once | INTRAVENOUS | Status: AC
  Administered 2020-03-25: 1000 mL via INTRAVENOUS

## 2020-03-25 MED ORDER — OXYCODONE HCL 5 MG PO TABS
5.0000 mg | ORAL_TABLET | ORAL | Status: DC | PRN
  Administered 2020-03-28 (×2): 5 mg via ORAL
  Filled 2020-03-25 (×2): qty 1

## 2020-03-25 MED ORDER — POTASSIUM CHLORIDE IN NACL 20-0.45 MEQ/L-% IV SOLN
INTRAVENOUS | Status: DC
  Filled 2020-03-25 (×2): qty 1000

## 2020-03-25 MED ORDER — OXYCODONE HCL 5 MG PO TABS
10.0000 mg | ORAL_TABLET | ORAL | Status: DC | PRN
  Administered 2020-03-26 – 2020-03-29 (×7): 10 mg via ORAL
  Filled 2020-03-25 (×9): qty 2

## 2020-03-25 MED ORDER — PANTOPRAZOLE SODIUM 40 MG IV SOLR
40.0000 mg | Freq: Every day | INTRAVENOUS | Status: DC
  Administered 2020-03-25: 40 mg via INTRAVENOUS
  Filled 2020-03-25 (×2): qty 40

## 2020-03-25 NOTE — Progress Notes (Signed)
Orthopedic Tech Progress Note Patient Details:  Manuel Fuller 1961-01-31 [id number]  Patient ID: [name], male   DOB: Jan 31, 1961, 59 y.o.   MRN: [id number] Pt already has splint applied from last hospital.   Trinna Post 03/25/2020, 7:51 PM
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
## 2020-03-25 NOTE — ED Notes (Signed)
Spoke with Michele Mcalpine at [facility] will send a truck to transport patient to [facility].
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
## 2020-03-25 NOTE — ED Notes (Signed)
[phone] Anil Havard (mother)

## 2020-03-25 NOTE — ED Notes (Signed)
CRITICAL VALUE ALERT  Critical Value:  Lactic 2.3  Date & Time Notied:  03/25/2020 @ 1647  Provider Notified: Dr Rubin Payor   Orders Received/Actions taken: no orders given at this time

## 2020-03-25 NOTE — ED Notes (Signed)
EDP aware of patient's status-B/P.
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
## 2020-03-25 NOTE — ED Notes (Addendum)
Pt O2 saturation dropping to 88% and pt maintained. Pt placed on 2L [place].
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
## 2020-03-26 ENCOUNTER — Encounter (HOSPITAL_COMMUNITY): Payer: Self-pay

## 2020-03-26 LAB — COMPREHENSIVE METABOLIC PANEL
ALT: 42 U/L (ref 0–44)
AST: 52 U/L — ABNORMAL HIGH (ref 15–41)
Albumin: 3.8 g/dL (ref 3.5–5.0)
Alkaline Phosphatase: 37 U/L — ABNORMAL LOW (ref 38–126)
Anion gap: 10 (ref 5–15)
BUN: 18 mg/dL (ref 6–20)
CO2: 24 mmol/L (ref 22–32)
Calcium: 9 mg/dL (ref 8.9–10.3)
Chloride: 106 mmol/L (ref 98–111)
Creatinine, Ser: 0.94 mg/dL (ref 0.61–1.24)
GFR calc Af Amer: >60 mL/min (ref >60)
GFR calc non Af Amer: >60 mL/min (ref >60)
Glucose, Bld: 145 mg/dL — ABNORMAL HIGH (ref 70–99)
Potassium: 3.8 mmol/L (ref 3.5–5.1)
Sodium: 140 mmol/L (ref 135–145)
Total Bilirubin: 1.4 mg/dL — ABNORMAL HIGH (ref 0.3–1.2)
Total Protein: 6.9 g/dL (ref 6.5–8.1)

## 2020-03-26 LAB — CBC
HCT: 42.4 % (ref 39.0–52.0)
Hemoglobin: 13.6 g/dL (ref 13.0–17.0)
MCH: 29.5 pg (ref 26.0–34.0)
MCHC: 32.1 g/dL (ref 30.0–36.0)
MCV: 92 fL (ref 80.0–100.0)
Platelets: 218 10*3/uL (ref 150–400)
RBC: 4.61 MIL/uL (ref 4.22–5.81)
RDW: 13.6 % (ref 11.5–15.5)
WBC: 9.8 10*3/uL (ref 4.0–10.5)
nRBC: 0 % (ref 0.0–0.2)

## 2020-03-26 LAB — SURGICAL PCR SCREEN
MRSA, PCR: NEGATIVE
Staphylococcus aureus: POSITIVE — AB

## 2020-03-26 LAB — GLUCOSE, CAPILLARY
Glucose-Capillary: 127 mg/dL — ABNORMAL HIGH (ref 70–99)
Glucose-Capillary: 154 mg/dL — ABNORMAL HIGH (ref 70–99)
Glucose-Capillary: 155 mg/dL — ABNORMAL HIGH (ref 70–99)
Glucose-Capillary: 137 mg/dL — ABNORMAL HIGH (ref 70–99)

## 2020-03-26 MED ORDER — MUPIROCIN 2 % EX OINT
1.0000 "application " | TOPICAL_OINTMENT | Freq: Two times a day (BID) | CUTANEOUS | Status: DC
  Administered 2020-03-26 – 2020-03-29 (×6): 1 via NASAL
  Filled 2020-03-26 (×2): qty 22

## 2020-03-26 MED ORDER — ATORVASTATIN CALCIUM 10 MG PO TABS
20.0000 mg | ORAL_TABLET | Freq: Every day | ORAL | Status: DC
  Administered 2020-03-26 – 2020-03-28 (×3): 20 mg via ORAL
  Filled 2020-03-26 (×3): qty 2

## 2020-03-26 MED ORDER — LIDOCAINE 5 % EX PTCH
1.0000 | MEDICATED_PATCH | CUTANEOUS | Status: DC
  Administered 2020-03-26 – 2020-03-27 (×2): 1 via TRANSDERMAL
  Filled 2020-03-26 (×2): qty 1

## 2020-03-26 MED ORDER — GUAIFENESIN 200 MG PO TABS
200.0000 mg | ORAL_TABLET | ORAL | Status: DC | PRN
  Administered 2020-03-26: 200 mg via ORAL
  Filled 2020-03-26 (×2): qty 1

## 2020-03-26 MED ORDER — POLYETHYLENE GLYCOL 3350 17 G PO PACK
17.0000 g | PACK | Freq: Every day | ORAL | Status: DC
  Administered 2020-03-27 – 2020-03-29 (×3): 17 g via ORAL
  Filled 2020-03-26 (×4): qty 1

## 2020-03-26 MED ORDER — TRAMADOL HCL 50 MG PO TABS
50.0000 mg | ORAL_TABLET | Freq: Three times a day (TID) | ORAL | Status: DC | PRN
  Administered 2020-03-26: 50 mg via ORAL
  Filled 2020-03-26: qty 1

## 2020-03-26 MED ORDER — METHOCARBAMOL 500 MG PO TABS
500.0000 mg | ORAL_TABLET | Freq: Three times a day (TID) | ORAL | Status: DC
  Administered 2020-03-26 – 2020-03-27 (×3): 500 mg via ORAL
  Filled 2020-03-26 (×3): qty 1
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
## 2020-03-26 NOTE — Progress Notes (Signed)
Inpatient Rehab Admissions Coordinator Note:   Per therapy recommendations, pt was screened for CIR candidacy by Aracelly Tencza, MS CCC-SLP. At this time, Pt. Appears to have functional decline and is a good candidate for CIR. Will place order for rehab consult per protocol.  Please contact me with questions.   Tranise Forrest, MS, CCC-SLP Rehab Admissions Coordinator  [phone] (celll) [phone] (office)  
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
## 2020-03-26 NOTE — TOC CAGE-AID Note (Signed)
Transition of Care [facility]) - CAGE-AID Screening   Patient Details  Name: Manuel Fuller MRN: [id number] Date of Birth: 1960-11-19  Transition of Care [facility]) CM/SW Contact:    Jimmy Picket, LCSWA Phone Number: 03/26/2020, 12:49 PM   Clinical Narrative:  Pt denied alcohol use and substance use.   CAGE-AID Screening:    Have You Ever Felt You Ought to Cut Down on Your Drinking or Drug Use?: No Have People Annoyed You By Critizing Your Drinking Or Drug Use?: No Have You Felt Bad Or Guilty About Your Drinking Or Drug Use?: No Have You Ever Had a Drink or Used Drugs First Thing In The Morning to STeady Your Nerves or to Get Rid of a Hangover?: No CAGE-AID Score: 0  Substance Abuse Education Offered: Yes  Substance abuse interventions: Patient Counseling  Jimmy Picket, Theresia Majors, [place] Clinical Social Worker [phone]

## 2020-03-26 NOTE — Plan of Care (Signed)

## 2020-03-26 NOTE — Plan of Care (Signed)
  Problem: Pain Managment: Goal: General experience of comfort will improve Outcome: Progressing   Problem: Safety: Goal: Ability to remain free from injury will improve Outcome: Progressing   Problem: Skin Integrity: Goal: Risk for impaired skin integrity will decrease Outcome: Progressing   
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
## 2020-03-27 ENCOUNTER — Inpatient Hospital Stay (HOSPITAL_COMMUNITY): Payer: BC Managed Care – PPO | Admitting: Anesthesiology

## 2020-03-27 ENCOUNTER — Inpatient Hospital Stay (HOSPITAL_COMMUNITY): Payer: BC Managed Care – PPO

## 2020-03-27 ENCOUNTER — Encounter (HOSPITAL_COMMUNITY): Admission: EM | Disposition: A | Discharge status: Rehabilitation Facility | Payer: Self-pay | Source: Home / Self Care

## 2020-03-27 HISTORY — PX: ORIF FOOT FRACTURE: SHX2123

## 2020-03-27 HISTORY — PX: OPEN REDUCTION INTERNAL FIXATION (ORIF) FOOT LISFRANC FRACTURE: SHX5990

## 2020-03-27 LAB — COMPREHENSIVE METABOLIC PANEL
ALT: 35 U/L (ref 0–44)
AST: 43 U/L — ABNORMAL HIGH (ref 15–41)
Albumin: 3.5 g/dL (ref 3.5–5.0)
Alkaline Phosphatase: 38 U/L (ref 38–126)
Anion gap: 10 (ref 5–15)
BUN: 16 mg/dL (ref 6–20)
CO2: 25 mmol/L (ref 22–32)
Calcium: 8.9 mg/dL (ref 8.9–10.3)
Chloride: 100 mmol/L (ref 98–111)
Creatinine, Ser: 0.83 mg/dL (ref 0.61–1.24)
GFR calc Af Amer: >60 mL/min (ref >60)
GFR calc non Af Amer: >60 mL/min (ref >60)
Glucose, Bld: 183 mg/dL — ABNORMAL HIGH (ref 70–99)
Potassium: 3.5 mmol/L (ref 3.5–5.1)
Sodium: 135 mmol/L (ref 135–145)
Total Bilirubin: 1.4 mg/dL — ABNORMAL HIGH (ref 0.3–1.2)
Total Protein: 6.5 g/dL (ref 6.5–8.1)

## 2020-03-27 LAB — GLUCOSE, CAPILLARY
Glucose-Capillary: 202 mg/dL — ABNORMAL HIGH (ref 70–99)
Glucose-Capillary: 202 mg/dL — ABNORMAL HIGH (ref 70–99)
Glucose-Capillary: 183 mg/dL — ABNORMAL HIGH (ref 70–99)

## 2020-03-27 LAB — CBC
HCT: 37.8 % — ABNORMAL LOW (ref 39.0–52.0)
Hemoglobin: 12.3 g/dL — ABNORMAL LOW (ref 13.0–17.0)
MCH: 29.6 pg (ref 26.0–34.0)
MCHC: 32.5 g/dL (ref 30.0–36.0)
MCV: 91.1 fL (ref 80.0–100.0)
Platelets: 173 10*3/uL (ref 150–400)
RBC: 4.15 MIL/uL — ABNORMAL LOW (ref 4.22–5.81)
RDW: 13.3 % (ref 11.5–15.5)
WBC: 8.8 10*3/uL (ref 4.0–10.5)
nRBC: 0 % (ref 0.0–0.2)

## 2020-03-27 SURGERY — OPEN REDUCTION INTERNAL FIXATION (ORIF) FOOT LISFRANC FRACTURE
Anesthesia: Regional | Site: Foot | Laterality: Left

## 2020-03-27 MED ORDER — OXYCODONE HCL 5 MG/5ML PO SOLN: 5.0000 mg | Freq: Once | ORAL | Status: DC | PRN

## 2020-03-27 MED ORDER — MIDAZOLAM HCL 2 MG/2ML IJ SOLN
INTRAMUSCULAR | Status: DC | PRN
  Administered 2020-03-27: 1 mg via INTRAVENOUS

## 2020-03-27 MED ORDER — PHENYLEPHRINE HCL-NACL 10-0.9 MG/250ML-% IV SOLN
INTRAVENOUS | Status: DC | PRN
  Administered 2020-03-27: 30 ug/min via INTRAVENOUS

## 2020-03-27 MED ORDER — ONDANSETRON HCL 4 MG/2ML IJ SOLN
INTRAMUSCULAR | Status: DC | PRN
  Administered 2020-03-27: 4 mg via INTRAVENOUS

## 2020-03-27 MED ORDER — ROPIVACAINE HCL 5 MG/ML IJ SOLN
INTRAMUSCULAR | Status: DC | PRN
Start: 2020-03-27 — End: 2020-03-27
  Administered 2020-03-27: 60 mL via PERINEURAL

## 2020-03-27 MED ORDER — LIDOCAINE 2% (20 MG/ML) 5 ML SYRINGE
INTRAMUSCULAR | Status: AC
  Filled 2020-03-27: qty 5

## 2020-03-27 MED ORDER — ROCURONIUM BROMIDE 10 MG/ML (PF) SYRINGE
PREFILLED_SYRINGE | INTRAVENOUS | Status: DC | PRN
  Administered 2020-03-27 (×2): 50 mg via INTRAVENOUS

## 2020-03-27 MED ORDER — DEXAMETHASONE SODIUM PHOSPHATE 10 MG/ML IJ SOLN
INTRAMUSCULAR | Status: DC | PRN
  Administered 2020-03-27: 10 mg

## 2020-03-27 MED ORDER — 0.9 % SODIUM CHLORIDE (POUR BTL) OPTIME
TOPICAL | Status: DC | PRN
  Administered 2020-03-27: 1000 mL

## 2020-03-27 MED ORDER — ALBUMIN HUMAN 5 % IV SOLN
INTRAVENOUS | Status: DC | PRN
Start: 2020-03-27 — End: 2020-03-27

## 2020-03-27 MED ORDER — KETOROLAC TROMETHAMINE 30 MG/ML IJ SOLN: 30.0000 mg | Freq: Once | INTRAMUSCULAR | Status: DC | PRN

## 2020-03-27 MED ORDER — ACETAMINOPHEN 10 MG/ML IV SOLN
INTRAVENOUS | Status: DC | PRN
Start: 2020-03-27 — End: 2020-03-27
  Administered 2020-03-27: 1000 mg via INTRAVENOUS

## 2020-03-27 MED ORDER — PHENYLEPHRINE HCL-NACL 10-0.9 MG/250ML-% IV SOLN
INTRAVENOUS | Status: AC
  Filled 2020-03-27: qty 750

## 2020-03-27 MED ORDER — SUCCINYLCHOLINE CHLORIDE 200 MG/10ML IV SOSY
PREFILLED_SYRINGE | INTRAVENOUS | Status: DC | PRN
  Administered 2020-03-27: 120 mg via INTRAVENOUS

## 2020-03-27 MED ORDER — FENTANYL CITRATE (PF) 250 MCG/5ML IJ SOLN
INTRAMUSCULAR | Status: DC | PRN
  Administered 2020-03-27: 100 ug via INTRAVENOUS
  Administered 2020-03-27: 50 ug via INTRAVENOUS

## 2020-03-27 MED ORDER — DEXTROSE 5 % IV SOLN
3.0000 g | INTRAVENOUS | Status: AC
  Administered 2020-03-27: 3 g via INTRAVENOUS
  Filled 2020-03-27: qty 3

## 2020-03-27 MED ORDER — OXYCODONE HCL 5 MG PO TABS
ORAL_TABLET | ORAL | Status: AC
  Filled 2020-03-27: qty 1

## 2020-03-27 MED ORDER — SUGAMMADEX SODIUM 200 MG/2ML IV SOLN
INTRAVENOUS | Status: DC | PRN
  Administered 2020-03-27: 275.8 mg via INTRAVENOUS

## 2020-03-27 MED ORDER — PROPOFOL 10 MG/ML IV BOLUS
INTRAVENOUS | Status: DC | PRN
  Administered 2020-03-27: 200 mg via INTRAVENOUS

## 2020-03-27 MED ORDER — SUGAMMADEX SODIUM 500 MG/5ML IV SOLN
INTRAVENOUS | Status: AC
  Filled 2020-03-27: qty 5

## 2020-03-27 MED ORDER — LACTATED RINGERS IV SOLN: INTRAVENOUS | Status: DC | PRN

## 2020-03-27 MED ORDER — CEFAZOLIN SODIUM-DEXTROSE 2-4 GM/100ML-% IV SOLN
2.0000 g | Freq: Three times a day (TID) | INTRAVENOUS | Status: AC
  Administered 2020-03-27 – 2020-03-28 (×3): 2 g via INTRAVENOUS
  Filled 2020-03-27 (×3): qty 100

## 2020-03-27 MED ORDER — DEXAMETHASONE SODIUM PHOSPHATE 10 MG/ML IJ SOLN
INTRAMUSCULAR | Status: AC
  Filled 2020-03-27: qty 1

## 2020-03-27 MED ORDER — ONDANSETRON HCL 4 MG/2ML IJ SOLN
INTRAMUSCULAR | Status: AC
  Filled 2020-03-27: qty 2

## 2020-03-27 MED ORDER — FENTANYL CITRATE (PF) 250 MCG/5ML IJ SOLN
INTRAMUSCULAR | Status: AC
  Filled 2020-03-27: qty 5

## 2020-03-27 MED ORDER — ROCURONIUM BROMIDE 10 MG/ML (PF) SYRINGE
PREFILLED_SYRINGE | INTRAVENOUS | Status: AC
  Filled 2020-03-27: qty 10

## 2020-03-27 MED ORDER — PROPOFOL 10 MG/ML IV BOLUS
INTRAVENOUS | Status: AC
  Filled 2020-03-27: qty 40

## 2020-03-27 MED ORDER — HYDROMORPHONE HCL 1 MG/ML IJ SOLN
0.2500 mg | INTRAMUSCULAR | Status: DC | PRN
  Administered 2020-03-27 (×2): 0.5 mg via INTRAVENOUS

## 2020-03-27 MED ORDER — OXYCODONE HCL 5 MG PO TABS: 5.0000 mg | ORAL_TABLET | Freq: Once | ORAL | Status: DC | PRN

## 2020-03-27 MED ORDER — HYDROMORPHONE HCL 1 MG/ML IJ SOLN
INTRAMUSCULAR | Status: AC
  Filled 2020-03-27: qty 1

## 2020-03-27 MED ORDER — ONDANSETRON HCL 4 MG/2ML IJ SOLN: 4.0000 mg | Freq: Once | INTRAMUSCULAR | Status: DC | PRN

## 2020-03-27 MED ORDER — ALBUTEROL SULFATE HFA 108 (90 BASE) MCG/ACT IN AERS
INHALATION_SPRAY | RESPIRATORY_TRACT | Status: DC | PRN
Start: 2020-03-27 — End: 2020-03-27
  Administered 2020-03-27: 4 via RESPIRATORY_TRACT

## 2020-03-27 MED ORDER — METHOCARBAMOL 500 MG PO TABS
1000.0000 mg | ORAL_TABLET | Freq: Three times a day (TID) | ORAL | Status: DC
  Administered 2020-03-27 – 2020-03-28 (×2): 1000 mg via ORAL
  Filled 2020-03-27 (×2): qty 2

## 2020-03-27 MED ORDER — MIDAZOLAM HCL 2 MG/2ML IJ SOLN
INTRAMUSCULAR | Status: AC
  Filled 2020-03-27: qty 2

## 2020-03-27 MED ORDER — BISACODYL 10 MG RE SUPP
10.0000 mg | Freq: Once | RECTAL | Status: DC
  Filled 2020-03-27: qty 1

## 2020-03-27 MED ORDER — SUCCINYLCHOLINE CHLORIDE 200 MG/10ML IV SOSY
PREFILLED_SYRINGE | INTRAVENOUS | Status: AC
  Filled 2020-03-27: qty 10

## 2020-03-27 MED ORDER — LIDOCAINE 2% (20 MG/ML) 5 ML SYRINGE
INTRAMUSCULAR | Status: DC | PRN
  Administered 2020-03-27: 20 mg via INTRAVENOUS

## 2020-03-27 SURGICAL SUPPLY — 66 items
BANDAGE ESMARK 6X9 LF (GAUZE/BANDAGES/DRESSINGS) ×1 IMPLANT
BIT DRILL 2.4X140 LONG SOLID (BIT) ×3 IMPLANT
BNDG ELASTIC 4X5.8 VLCR STR LF (GAUZE/BANDAGES/DRESSINGS) ×3 IMPLANT
BNDG ELASTIC 6X5.8 VLCR STR LF (GAUZE/BANDAGES/DRESSINGS) ×3 IMPLANT
BNDG ESMARK 6X9 LF (GAUZE/BANDAGES/DRESSINGS) ×3
BNDG GAUZE ELAST 4 BULKY (GAUZE/BANDAGES/DRESSINGS) ×6 IMPLANT
BRUSH SCRUB EZ PLAIN DRY (MISCELLANEOUS) ×3 IMPLANT
CAP PIN ORTHO PINK (CAP) ×6 IMPLANT
COVER MAYO STAND STRL (DRAPES) ×3 IMPLANT
COVER SURGICAL LIGHT HANDLE (MISCELLANEOUS) ×3 IMPLANT
COVER WAND RF STERILE (DRAPES) ×3 IMPLANT
DRAPE C-ARM 42X72 X-RAY (DRAPES) ×3 IMPLANT
DRAPE C-ARMOR (DRAPES) ×3 IMPLANT
DRAPE HALF SHEET 40X57 (DRAPES) ×3 IMPLANT
DRAPE U-SHAPE 47X51 STRL (DRAPES) ×3 IMPLANT
DRSG ADAPTIC 3X8 NADH LF (GAUZE/BANDAGES/DRESSINGS) ×3 IMPLANT
DRSG EMULSION OIL 3X3 NADH (GAUZE/BANDAGES/DRESSINGS) IMPLANT
ELECT REM PT RETURN 9FT ADLT (ELECTROSURGICAL) ×3
ELECTRODE REM PT RTRN 9FT ADLT (ELECTROSURGICAL) ×1 IMPLANT
GAUZE SPONGE 4X4 12PLY STRL (GAUZE/BANDAGES/DRESSINGS) IMPLANT
GAUZE SPONGE 4X4 12PLY STRL LF (GAUZE/BANDAGES/DRESSINGS) ×3 IMPLANT
GLOVE BIO SURGEON STRL SZ7.5 (GLOVE) ×3 IMPLANT
GLOVE BIO SURGEON STRL SZ8 (GLOVE) ×3 IMPLANT
GLOVE BIOGEL PI IND STRL 7.5 (GLOVE) ×1 IMPLANT
GLOVE BIOGEL PI IND STRL 8 (GLOVE) ×1 IMPLANT
GLOVE BIOGEL PI INDICATOR 7.5 (GLOVE) ×2
GLOVE BIOGEL PI INDICATOR 8 (GLOVE) ×2
GOWN STRL REUS W/ TWL LRG LVL3 (GOWN DISPOSABLE) ×2 IMPLANT
GOWN STRL REUS W/ TWL XL LVL3 (GOWN DISPOSABLE) ×1 IMPLANT
GOWN STRL REUS W/TWL LRG LVL3 (GOWN DISPOSABLE) ×4
GOWN STRL REUS W/TWL XL LVL3 (GOWN DISPOSABLE) ×2
KIT BASIN OR (CUSTOM PROCEDURE TRAY) ×3 IMPLANT
KIT TURNOVER KIT B (KITS) ×3 IMPLANT
MANIFOLD NEPTUNE II (INSTRUMENTS) ×3 IMPLANT
NEEDLE HYPO 21X1.5 SAFETY (NEEDLE) IMPLANT
NS IRRIG 1000ML POUR BTL (IV SOLUTION) ×3 IMPLANT
PACK ORTHO EXTREMITY (CUSTOM PROCEDURE TRAY) ×3 IMPLANT
PAD ABD 8X10 STRL (GAUZE/BANDAGES/DRESSINGS) ×3 IMPLANT
PAD ARMBOARD 7.5X6 YLW CONV (MISCELLANEOUS) IMPLANT
PAD CAST 4YDX4 CTTN HI CHSV (CAST SUPPLIES) ×1 IMPLANT
PADDING CAST COTTON 4X4 STRL (CAST SUPPLIES) ×2
PADDING CAST COTTON 6X4 STRL (CAST SUPPLIES) ×3 IMPLANT
SCREW COMPRESSION 5H (Screw) ×3 IMPLANT
SCREW LOCK PLATE R3 3.5X14 (Screw) ×3 IMPLANT
SCREW LOCK PLATE R3 3.5X22 (Screw) ×3 IMPLANT
SCREW NON LOCK 3.5X18 (Screw) ×3 IMPLANT
SCREW NON LOCKING 3.5X14 (Screw) ×6 IMPLANT
SCREW NON LOCKING 3.5X20 (Screw) ×3 IMPLANT
SPLINT PLASTER CAST XFAST 5X30 (CAST SUPPLIES) ×2 IMPLANT
SPLINT PLASTER XFAST SET 5X30 (CAST SUPPLIES) ×4
SPONGE LAP 18X18 RF (DISPOSABLE) ×3 IMPLANT
STAPLER VISISTAT 35W (STAPLE) IMPLANT
SUCTION FRAZIER HANDLE 10FR (MISCELLANEOUS) ×2
SUCTION TUBE FRAZIER 10FR DISP (MISCELLANEOUS) ×1 IMPLANT
SUT ETHILON 3 0 PS 1 (SUTURE) ×6 IMPLANT
SUT PDS AB 2-0 CT1 27 (SUTURE) IMPLANT
SUT VIC AB 2-0 CT1 27 (SUTURE) ×4
SUT VIC AB 2-0 CT1 TAPERPNT 27 (SUTURE) ×2 IMPLANT
SUT VIC AB 3-0 CT1 27 (SUTURE) ×2
SUT VIC AB 3-0 CT1 TAPERPNT 27 (SUTURE) ×1 IMPLANT
TOWEL GREEN STERILE (TOWEL DISPOSABLE) ×6 IMPLANT
TOWEL GREEN STERILE FF (TOWEL DISPOSABLE) ×3 IMPLANT
TUBE CONNECTING 12'X1/4 (SUCTIONS) ×1
TUBE CONNECTING 12X1/4 (SUCTIONS) ×2 IMPLANT
UNDERPAD 30X36 HEAVY ABSORB (UNDERPADS AND DIAPERS) ×3 IMPLANT
WATER STERILE IRR 1000ML POUR (IV SOLUTION) ×3 IMPLANT
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
## 2020-03-27 NOTE — Transfer of Care (Signed)
Immediate Anesthesia Transfer of Care Note  Patient: Manuel Fuller  Procedure(s) Performed: OPEN REDUCTION INTERNAL FIXATION (ORIF) FOOT LISFRANC FRACTURE (Left Foot)  Patient Location: PACU  Anesthesia Type:General and Regional  Level of Consciousness: awake, alert  and patient cooperative  Airway & Oxygen Therapy: Patient Spontanous Breathing and Patient connected to face mask oxygen  Post-op Assessment: Report given to RN and Post -op Vital signs reviewed and stable  Post vital signs: Reviewed and stable  Last Vitals:  Vitals Value Taken Time  BP 128/84 03/27/20 1137  Temp 36.7 C 03/27/20 1136  Pulse 78 03/27/20 1139  Resp 20 03/27/20 1139  SpO2 94 % 03/27/20 1139  Vitals shown include unvalidated device data.  Last Pain:  Vitals:   03/27/20 0500  TempSrc: Oral  PainSc:       Patients Stated Pain Goal: 1 (03/25/20 2336)  Complications: No complications documented.

## 2020-03-27 NOTE — Anesthesia Procedure Notes (Signed)
Procedure Name: Intubation Date/Time: 03/27/2020 8:19 AM Performed by: Loni Dolly, RN Pre-anesthesia Checklist: Patient identified, Emergency Drugs available, Suction available and Patient being monitored Patient Re-evaluated:Patient Re-evaluated prior to induction Oxygen Delivery Method: Circle System Utilized Preoxygenation: Pre-oxygenation with 100% oxygen Induction Type: IV induction Laryngoscope Size: Glidescope and 4 Grade View: Grade I Tube type: Oral Tube size: 7.5 mm Number of attempts: 1 Airway Equipment and Method: Stylet and Oral airway Placement Confirmation: ETT inserted through vocal cords under direct vision,  positive ETCO2 and breath sounds checked- equal and bilateral Tube secured with: Tape Dental Injury: Teeth and Oropharynx as per pre-operative assessment  Comments: Intubation by Loni Dolly SRNA

## 2020-03-27 NOTE — Anesthesia Postprocedure Evaluation (Signed)
Anesthesia Post Note  Patient: Manuel Fuller  Procedure(s) Performed: OPEN REDUCTION INTERNAL FIXATION (ORIF) FOOT LISFRANC FRACTURE (Left Foot)     Patient location during evaluation: PACU Anesthesia Type: Regional and General Level of consciousness: awake and alert Pain management: pain level controlled Vital Signs Assessment: post-procedure vital signs reviewed and stable Respiratory status: spontaneous breathing, nonlabored ventilation, respiratory function stable and patient connected to nasal cannula oxygen Cardiovascular status: blood pressure returned to baseline and stable Postop Assessment: no apparent nausea or vomiting Anesthetic complications: no   No complications documented.  Last Vitals:  Vitals:   03/27/20 1209 03/27/20 1225  BP: 124/81 121/82  Pulse: 73 70  Resp: 19 16  Temp:  (!) 36.4 C  SpO2: 100% 91%    Last Pain:  Vitals:   03/27/20 1230  TempSrc:   PainSc: 4                  Lannie Fields

## 2020-03-27 NOTE — Anesthesia Procedure Notes (Signed)
Anesthesia Regional Block: Adductor canal block   Pre-Anesthetic Checklist: ,, timeout performed, Correct Patient, Correct Site, Correct Laterality, Correct Procedure, Correct Position, site marked, Risks and benefits discussed,  Surgical consent,  Pre-op evaluation,  At surgeon's request and post-op pain management  Laterality: Left  Prep: Maximum Sterile Barrier Precautions used, chloraprep       Needles:  Injection technique: Single-shot  Needle Type: Echogenic Stimulator Needle     Needle Length: 9cm  Needle Gauge: 22     Additional Needles:   Procedures:,,,, ultrasound used (permanent image in chart),,,,  Narrative:  Start time: 03/27/2020 7:15 AM End time: 03/27/2020 7:20 AM Injection made incrementally with aspirations every 5 mL.  Performed by: Personally  Anesthesiologist: Lannie Fields, DO  Additional Notes: Monitors applied. No increased pain on injection. No increased resistance to injection. Injection made in 5cc increments. Good needle visualization. Patient tolerated procedure well.

## 2020-03-27 NOTE — Anesthesia Procedure Notes (Signed)
Anesthesia Regional Block: Popliteal block   Pre-Anesthetic Checklist: ,, timeout performed, Correct Patient, Correct Site, Correct Laterality, Correct Procedure, Correct Position, site marked, Risks and benefits discussed,  Surgical consent,  Pre-op evaluation,  At surgeon's request and post-op pain management  Laterality: Left  Prep: Maximum Sterile Barrier Precautions used, chloraprep       Needles:  Injection technique: Single-shot  Needle Type: Echogenic Stimulator Needle     Needle Length: 9cm  Needle Gauge: 22     Additional Needles:   Procedures:,,,, ultrasound used (permanent image in chart),,,,  Narrative:  Start time: 03/27/2020 7:10 AM End time: 03/27/2020 7:15 AM Injection made incrementally with aspirations every 5 mL.  Performed by: Personally  Anesthesiologist: Lannie Fields, DO  Additional Notes: Monitors applied. No increased pain on injection. No increased resistance to injection. Injection made in 5cc increments. Good needle visualization. Patient tolerated procedure well.

## 2020-03-27 NOTE — Progress Notes (Signed)
No pain in operative site secondary to regional block . Unable to sense feeling lle from toes to just below l knee.. Cap refill less than 3 secs

## 2020-03-27 NOTE — Addendum Note (Signed)
Addendum  created 03/27/20 1258 by Dairl Ponder, CRNA   Attestation recorded in [place], Intraprocedure Attestations filed

## 2020-03-27 NOTE — Progress Notes (Signed)
Inpatient Rehabilitation-Admissions Coordinator   CIR consult received. Noted pt is in surgery today. AC will follow for therapy assessment after most recent surgery to help determine if he has continued CIR needs.   Cheri Rous, OTR/L  Rehab Admissions Coordinator  [phone] 03/27/2020 11:45 AM

## 2020-03-28 ENCOUNTER — Encounter (HOSPITAL_COMMUNITY): Payer: Self-pay

## 2020-03-28 DIAGNOSIS — S93325S Dislocation of tarsometatarsal joint of left foot, sequela: Secondary | ICD-10-CM | POA: Diagnosis present

## 2020-03-28 DIAGNOSIS — S93325A Dislocation of tarsometatarsal joint of left foot, initial encounter: Secondary | ICD-10-CM

## 2020-03-28 HISTORY — DX: Dislocation of tarsometatarsal joint of left foot, initial encounter: S93.325A

## 2020-03-28 LAB — GLUCOSE, CAPILLARY
Glucose-Capillary: 154 mg/dL — ABNORMAL HIGH (ref 70–99)
Glucose-Capillary: 150 mg/dL — ABNORMAL HIGH (ref 70–99)
Glucose-Capillary: 155 mg/dL — ABNORMAL HIGH (ref 70–99)
Glucose-Capillary: 190 mg/dL — ABNORMAL HIGH (ref 70–99)

## 2020-03-28 LAB — CBC
HCT: 37.1 % — ABNORMAL LOW (ref 39.0–52.0)
Hemoglobin: 12.1 g/dL — ABNORMAL LOW (ref 13.0–17.0)
MCH: 29.7 pg (ref 26.0–34.0)
MCHC: 32.6 g/dL (ref 30.0–36.0)
MCV: 91.2 fL (ref 80.0–100.0)
Platelets: 188 10*3/uL (ref 150–400)
RBC: 4.07 MIL/uL — ABNORMAL LOW (ref 4.22–5.81)
RDW: 13.2 % (ref 11.5–15.5)
WBC: 11.9 10*3/uL — ABNORMAL HIGH (ref 4.0–10.5)
nRBC: 0 % (ref 0.0–0.2)

## 2020-03-28 LAB — BASIC METABOLIC PANEL
Anion gap: 13 (ref 5–15)
BUN: 15 mg/dL (ref 6–20)
CO2: 25 mmol/L (ref 22–32)
Calcium: 9 mg/dL (ref 8.9–10.3)
Chloride: 100 mmol/L (ref 98–111)
Creatinine, Ser: 0.9 mg/dL (ref 0.61–1.24)
GFR calc Af Amer: >60 mL/min (ref >60)
GFR calc non Af Amer: >60 mL/min (ref >60)
Glucose, Bld: 156 mg/dL — ABNORMAL HIGH (ref 70–99)
Potassium: 3.6 mmol/L (ref 3.5–5.1)
Sodium: 138 mmol/L (ref 135–145)

## 2020-03-28 LAB — MAGNESIUM: Magnesium: 2 mg/dL (ref 1.7–2.4)

## 2020-03-28 MED ORDER — MORPHINE SULFATE (PF) 2 MG/ML IV SOLN
1.0000 mg | INTRAVENOUS | Status: DC | PRN
  Administered 2020-03-28 (×2): 2 mg via INTRAVENOUS
  Filled 2020-03-28 (×2): qty 1

## 2020-03-28 MED ORDER — GUAIFENESIN 200 MG PO TABS
200.0000 mg | ORAL_TABLET | Freq: Four times a day (QID) | ORAL | Status: DC
  Administered 2020-03-28 – 2020-03-29 (×4): 200 mg via ORAL
  Filled 2020-03-28 (×5): qty 1

## 2020-03-28 MED ORDER — METHOCARBAMOL 500 MG PO TABS
1000.0000 mg | ORAL_TABLET | Freq: Four times a day (QID) | ORAL | Status: DC
  Administered 2020-03-28 – 2020-03-29 (×6): 1000 mg via ORAL
  Filled 2020-03-28 (×7): qty 2
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
## 2020-03-28 NOTE — Progress Notes (Signed)
Inpatient Rehabilitation-Admissions Coordinator   Inpatient Rehab Consult received.  I met with pt at the bedside for rehabilitation assessment. Feel pt is a good candidate for CIR and anticipate he can get to Supervision level through [place] home with his mom to her house. Discussed recommended rehab program, including details about expectations, anticipated LOS, and expected functional outcomes. Pt would like to pursue this program if insurance approves. I have confirmed supervision support with his mom via phone. AC will begin insurance authorization process for possible admit.   Please call if questions.   Raechel Ache, OTR/L  Rehab Admissions Coordinator  [phone] 03/28/2020 4:52 PM
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
## 2020-03-28 NOTE — Plan of Care (Signed)
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
## 2020-03-29 ENCOUNTER — Inpatient Hospital Stay (HOSPITAL_COMMUNITY)
Admission: RE | Admit: 2020-03-29 | Discharge: 2020-04-12 | DRG: 560 | Disposition: A | Discharge status: Home Health | Payer: BC Managed Care – PPO | Source: Intra-hospital | Attending: Physical Medicine & Rehabilitation | Admitting: Physical Medicine & Rehabilitation

## 2020-03-29 DIAGNOSIS — M25512 Pain in left shoulder: Secondary | ICD-10-CM | POA: Diagnosis not present

## 2020-03-29 DIAGNOSIS — M5432 Sciatica, left side: Secondary | ICD-10-CM | POA: Diagnosis present

## 2020-03-29 DIAGNOSIS — Z713 Dietary counseling and surveillance: Secondary | ICD-10-CM | POA: Diagnosis not present

## 2020-03-29 DIAGNOSIS — E1165 Type 2 diabetes mellitus with hyperglycemia: Secondary | ICD-10-CM

## 2020-03-29 DIAGNOSIS — K5903 Drug induced constipation: Secondary | ICD-10-CM

## 2020-03-29 DIAGNOSIS — Z6841 Body Mass Index (BMI) 40.0 and over, adult: Secondary | ICD-10-CM | POA: Diagnosis not present

## 2020-03-29 DIAGNOSIS — R21 Rash and other nonspecific skin eruption: Secondary | ICD-10-CM | POA: Diagnosis not present

## 2020-03-29 DIAGNOSIS — S2242XD Multiple fractures of ribs, left side, subsequent encounter for fracture with routine healing: Secondary | ICD-10-CM

## 2020-03-29 DIAGNOSIS — S2221XD Fracture of manubrium, subsequent encounter for fracture with routine healing: Secondary | ICD-10-CM

## 2020-03-29 DIAGNOSIS — S93325S Dislocation of tarsometatarsal joint of left foot, sequela: Secondary | ICD-10-CM

## 2020-03-29 DIAGNOSIS — S2220XS Unspecified fracture of sternum, sequela: Secondary | ICD-10-CM | POA: Diagnosis not present

## 2020-03-29 DIAGNOSIS — S93325A Dislocation of tarsometatarsal joint of left foot, initial encounter: Secondary | ICD-10-CM

## 2020-03-29 DIAGNOSIS — G8918 Other acute postprocedural pain: Secondary | ICD-10-CM

## 2020-03-29 DIAGNOSIS — S92812D Other fracture of left foot, subsequent encounter for fracture with routine healing: Principal | ICD-10-CM

## 2020-03-29 DIAGNOSIS — E785 Hyperlipidemia, unspecified: Secondary | ICD-10-CM | POA: Diagnosis present

## 2020-03-29 DIAGNOSIS — I10 Essential (primary) hypertension: Secondary | ICD-10-CM | POA: Diagnosis present

## 2020-03-29 DIAGNOSIS — R609 Edema, unspecified: Secondary | ICD-10-CM | POA: Diagnosis not present

## 2020-03-29 DIAGNOSIS — E1169 Type 2 diabetes mellitus with other specified complication: Secondary | ICD-10-CM | POA: Diagnosis not present

## 2020-03-29 LAB — CBC
HCT: 37.7 % — ABNORMAL LOW (ref 39.0–52.0)
HCT: 40.5 % (ref 39.0–52.0)
Hemoglobin: 12.2 g/dL — ABNORMAL LOW (ref 13.0–17.0)
Hemoglobin: 13.3 g/dL (ref 13.0–17.0)
MCH: 29.4 pg (ref 26.0–34.0)
MCH: 29.7 pg (ref 26.0–34.0)
MCHC: 32.4 g/dL (ref 30.0–36.0)
MCHC: 32.8 g/dL (ref 30.0–36.0)
MCV: 90.8 fL (ref 80.0–100.0)
MCV: 90.4 fL (ref 80.0–100.0)
Platelets: 228 10*3/uL (ref 150–400)
Platelets: 243 10*3/uL (ref 150–400)
RBC: 4.15 MIL/uL — ABNORMAL LOW (ref 4.22–5.81)
RBC: 4.48 MIL/uL (ref 4.22–5.81)
RDW: 13.3 % (ref 11.5–15.5)
RDW: 13.2 % (ref 11.5–15.5)
WBC: 9.9 10*3/uL (ref 4.0–10.5)
WBC: 9.6 10*3/uL (ref 4.0–10.5)
nRBC: 0 % (ref 0.0–0.2)
nRBC: 0 % (ref 0.0–0.2)

## 2020-03-29 LAB — GLUCOSE, CAPILLARY
Glucose-Capillary: 131 mg/dL — ABNORMAL HIGH (ref 70–99)
Glucose-Capillary: 122 mg/dL — ABNORMAL HIGH (ref 70–99)
Glucose-Capillary: 152 mg/dL — ABNORMAL HIGH (ref 70–99)
Glucose-Capillary: 169 mg/dL — ABNORMAL HIGH (ref 70–99)

## 2020-03-29 LAB — CREATININE, SERUM
Creatinine, Ser: 1.08 mg/dL (ref 0.61–1.24)
GFR calc Af Amer: >60 mL/min (ref >60)
GFR calc non Af Amer: >60 mL/min (ref >60)

## 2020-03-29 MED ORDER — ONDANSETRON 4 MG PO TBDP: 4.0000 mg | ORAL_TABLET | Freq: Four times a day (QID) | ORAL | Status: DC | PRN

## 2020-03-29 MED ORDER — ONDANSETRON HCL 4 MG/2ML IJ SOLN: 4.0000 mg | Freq: Four times a day (QID) | INTRAMUSCULAR | Status: DC | PRN

## 2020-03-29 MED ORDER — GABAPENTIN 100 MG PO CAPS
200.0000 mg | ORAL_CAPSULE | Freq: Three times a day (TID) | ORAL | Status: DC
  Administered 2020-03-29 – 2020-04-12 (×40): 200 mg via ORAL
  Filled 2020-03-29 (×41): qty 2

## 2020-03-29 MED ORDER — ACETAMINOPHEN 325 MG PO TABS
325.0000 mg | ORAL_TABLET | ORAL | Status: DC | PRN
  Administered 2020-03-31 – 2020-04-01 (×3): 650 mg via ORAL
  Filled 2020-03-29 (×4): qty 2

## 2020-03-29 MED ORDER — GUAIFENESIN 200 MG PO TABS
200.0000 mg | ORAL_TABLET | Freq: Four times a day (QID) | ORAL | Status: AC
  Administered 2020-03-29 – 2020-03-30 (×5): 200 mg via ORAL
  Filled 2020-03-29 (×5): qty 1

## 2020-03-29 MED ORDER — ATORVASTATIN CALCIUM 10 MG PO TABS
20.0000 mg | ORAL_TABLET | Freq: Every day | ORAL | Status: DC
  Administered 2020-03-29 – 2020-04-11 (×14): 20 mg via ORAL
  Filled 2020-03-29 (×14): qty 2

## 2020-03-29 MED ORDER — POLYETHYLENE GLYCOL 3350 17 G PO PACK
17.0000 g | PACK | Freq: Every day | ORAL | Status: DC
  Administered 2020-03-30 – 2020-04-12 (×14): 17 g via ORAL
  Filled 2020-03-29 (×14): qty 1

## 2020-03-29 MED ORDER — GUAIFENESIN 200 MG PO TABS
200.0000 mg | ORAL_TABLET | Freq: Four times a day (QID) | ORAL | Status: DC
  Administered 2020-03-29 (×2): 200 mg via ORAL
  Filled 2020-03-29 (×4): qty 1

## 2020-03-29 MED ORDER — TRAMADOL HCL 50 MG PO TABS: 50.0000 mg | ORAL_TABLET | Freq: Three times a day (TID) | ORAL | Status: DC | PRN

## 2020-03-29 MED ORDER — OXYCODONE HCL 5 MG PO TABS
5.0000 mg | ORAL_TABLET | ORAL | Status: DC | PRN
  Administered 2020-04-04 (×2): 5 mg via ORAL
  Filled 2020-03-29 (×3): qty 1

## 2020-03-29 MED ORDER — LISINOPRIL 20 MG PO TABS
20.0000 mg | ORAL_TABLET | Freq: Every day | ORAL | Status: DC
  Administered 2020-03-30 – 2020-04-11 (×12): 20 mg via ORAL
  Filled 2020-03-29 (×14): qty 1

## 2020-03-29 MED ORDER — SORBITOL 70 % SOLN
30.0000 mL | Freq: Every day | Status: DC | PRN
  Administered 2020-03-30 – 2020-04-03 (×2): 30 mL via ORAL
  Filled 2020-03-29 (×2): qty 30

## 2020-03-29 MED ORDER — METHOCARBAMOL 500 MG PO TABS
1000.0000 mg | ORAL_TABLET | Freq: Four times a day (QID) | ORAL | Status: DC
  Administered 2020-03-29 – 2020-04-12 (×55): 1000 mg via ORAL
  Filled 2020-03-29 (×57): qty 2

## 2020-03-29 MED ORDER — METFORMIN HCL 500 MG PO TABS
500.0000 mg | ORAL_TABLET | Freq: Two times a day (BID) | ORAL | Status: DC
  Administered 2020-03-30 – 2020-04-12 (×27): 500 mg via ORAL
  Filled 2020-03-29 (×27): qty 1

## 2020-03-29 MED ORDER — DOCUSATE SODIUM 100 MG PO CAPS
100.0000 mg | ORAL_CAPSULE | Freq: Two times a day (BID) | ORAL | Status: DC
  Administered 2020-03-29 – 2020-04-03 (×10): 100 mg via ORAL
  Filled 2020-03-29 (×10): qty 1

## 2020-03-29 MED ORDER — HYDROCHLOROTHIAZIDE 25 MG PO TABS
25.0000 mg | ORAL_TABLET | Freq: Every day | ORAL | Status: DC
  Administered 2020-03-30 – 2020-04-10 (×12): 25 mg via ORAL
  Filled 2020-03-29 (×13): qty 1

## 2020-03-29 MED ORDER — MAGNESIUM CITRATE PO SOLN
1.0000 | Freq: Once | ORAL | Status: AC
  Administered 2020-03-29: 1 via ORAL
  Filled 2020-03-29: qty 296

## 2020-03-29 MED ORDER — MUPIROCIN 2 % EX OINT
1.0000 "application " | TOPICAL_OINTMENT | Freq: Two times a day (BID) | CUTANEOUS | Status: AC
  Administered 2020-03-29 – 2020-03-30 (×2): 1 via NASAL
  Filled 2020-03-29: qty 22

## 2020-03-29 MED ORDER — ENOXAPARIN SODIUM 40 MG/0.4ML ~~LOC~~ SOLN
40.0000 mg | Freq: Two times a day (BID) | SUBCUTANEOUS | Status: DC
  Administered 2020-03-29 – 2020-04-12 (×28): 40 mg via SUBCUTANEOUS
  Filled 2020-03-29 (×28): qty 0.4

## 2020-03-29 MED ORDER — INSULIN ASPART 100 UNIT/ML ~~LOC~~ SOLN
0.0000 [IU] | Freq: Three times a day (TID) | SUBCUTANEOUS | Status: DC
  Administered 2020-03-30 – 2020-03-31 (×6): 3 [IU] via SUBCUTANEOUS
  Administered 2020-04-01: 4 [IU] via SUBCUTANEOUS
  Administered 2020-04-01: 3 [IU] via SUBCUTANEOUS
  Administered 2020-04-02: 4 [IU] via SUBCUTANEOUS
  Administered 2020-04-02 – 2020-04-07 (×7): 3 [IU] via SUBCUTANEOUS
  Administered 2020-04-08 – 2020-04-09 (×2): 4 [IU] via SUBCUTANEOUS
  Administered 2020-04-09 – 2020-04-10 (×2): 3 [IU] via SUBCUTANEOUS
  Administered 2020-04-11: 4 [IU] via SUBCUTANEOUS

## 2020-03-29 MED ORDER — OXYCODONE HCL 5 MG PO TABS
10.0000 mg | ORAL_TABLET | ORAL | Status: DC | PRN
  Administered 2020-03-29 – 2020-04-12 (×28): 10 mg via ORAL
  Filled 2020-03-29 (×32): qty 2

## 2020-03-29 MED ORDER — ENOXAPARIN SODIUM 40 MG/0.4ML ~~LOC~~ SOLN: 40.0000 mg | SUBCUTANEOUS | Status: DC

## 2020-03-29 NOTE — Progress Notes (Signed)
Inpatient Rehabilitation Medication Review by a Pharmacist  A complete drug regimen review was completed for this patient to identify any potential clinically significant medication issues.  Clinically significant medication issues were identified: None   Time spent performing this drug regimen review (minutes):  5 minutes   Elwin Sleight 03/29/2020 8:43 PM

## 2020-03-29 NOTE — Plan of Care (Signed)

## 2020-03-29 NOTE — Progress Notes (Signed)
Inpatient Rehabilitation-Admissions Coordinator   I have received insurance approval and medical clearance from attending service for admit to CIR today. Notified pt of bed offer and he has accepted. Reviewed insurance benefits letter and consent forms. All questions answered.   RN and [facility] team notified of plan for today.   Cheri Rous, OTR/L  Rehab Admissions Coordinator  [phone] 03/29/2020 2:36 PM
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
## 2020-03-29 NOTE — Discharge Instructions (Signed)
Rib Fracture  A rib fracture is a break or crack in one of the bones of the ribs. The ribs are like a cage that goes around your upper chest. A broken or cracked rib is often painful, but most do not cause other problems. Most rib fractures usually heal on their own in 1-3 months. Follow these instructions at home: Managing pain, stiffness, and swelling  If directed, apply ice to the injured area. ? Put ice in a plastic bag. ? Place a towel between your skin and the bag. ? Leave the ice on for 20 minutes, 2-3 times a day.  Take over-the-counter and prescription medicines only as told by your doctor. Activity  Avoid activities that cause pain to the injured area. Protect your injured area.  Slowly increase activity as told by your doctor. General instructions  Do deep breathing as told by your doctor. You may be told to: ? Take deep breaths many times a day. ? Cough many times a day while hugging a pillow. ? Use a device (incentive spirometer) to do deep breathing many times a day.  Drink enough fluid to keep your pee (urine) clear or pale yellow.  Do not wear a rib belt or binder. These do not allow you to breathe deeply.  Keep all follow-up visits as told by your doctor. This is important. Contact a doctor if:  You have a fever. Get help right away if:  You have trouble breathing.  You are short of breath.  You cannot stop coughing.  You cough up thick or bloody spit (sputum).  You feel sick to your stomach (nauseous), throw up (vomit), or have belly (abdominal) pain.  Your pain gets worse and medicine does not help. Summary  A rib fracture is a break or crack in one of the bones of the ribs.  Apply ice to the injured area and take medicines for pain as told by your doctor.  Take deep breaths and cough many times a day. Hug a pillow every time you cough. This information is not intended to replace advice given to you by your health care provider. Make sure you  discuss any questions you have with your health care provider. Document Revised: 08/28/2017 Document Reviewed: 12/16/2016 Elsevier Patient Education  2020 [employer]  
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
## 2020-03-29 NOTE — Progress Notes (Signed)
To rehab per bed accompanied by NT and RN alert and oriented patient. Fall precaution bundle discussed and signed by patient. Oriented to unit set up.

## 2020-03-29 NOTE — Progress Notes (Signed)
Report given to Edson Snowball, RN, Pt not in distress, all questions and concerns addressed, transported to 4M03 by RN and NT.
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
## 2020-03-30 ENCOUNTER — Inpatient Hospital Stay (HOSPITAL_COMMUNITY): Payer: BC Managed Care – PPO | Admitting: Physical Therapy

## 2020-03-30 ENCOUNTER — Inpatient Hospital Stay (HOSPITAL_COMMUNITY): Payer: BC Managed Care – PPO | Admitting: Occupational Therapy

## 2020-03-30 ENCOUNTER — Inpatient Hospital Stay (HOSPITAL_COMMUNITY): Payer: BC Managed Care – PPO

## 2020-03-30 ENCOUNTER — Other Ambulatory Visit: Payer: Self-pay

## 2020-03-30 ENCOUNTER — Encounter (HOSPITAL_COMMUNITY): Payer: Self-pay | Admitting: Physical Medicine & Rehabilitation

## 2020-03-30 DIAGNOSIS — E1169 Type 2 diabetes mellitus with other specified complication: Secondary | ICD-10-CM

## 2020-03-30 DIAGNOSIS — I10 Essential (primary) hypertension: Secondary | ICD-10-CM

## 2020-03-30 DIAGNOSIS — E669 Obesity, unspecified: Secondary | ICD-10-CM

## 2020-03-30 DIAGNOSIS — R609 Edema, unspecified: Secondary | ICD-10-CM

## 2020-03-30 DIAGNOSIS — S2220XS Unspecified fracture of sternum, sequela: Secondary | ICD-10-CM

## 2020-03-30 DIAGNOSIS — S93325S Dislocation of tarsometatarsal joint of left foot, sequela: Secondary | ICD-10-CM

## 2020-03-30 LAB — CBC WITH DIFFERENTIAL/PLATELET
Abs Immature Granulocytes: 0.08 10*3/uL — ABNORMAL HIGH (ref 0.00–0.07)
Basophils Absolute: 0 10*3/uL (ref 0.0–0.1)
Basophils Relative: 1 %
Eosinophils Absolute: 0.2 10*3/uL (ref 0.0–0.5)
Eosinophils Relative: 3 %
HCT: 40.7 % (ref 39.0–52.0)
Hemoglobin: 13 g/dL (ref 13.0–17.0)
Immature Granulocytes: 1 %
Lymphocytes Relative: 14 %
Lymphs Abs: 1.1 10*3/uL (ref 0.7–4.0)
MCH: 29.3 pg (ref 26.0–34.0)
MCHC: 31.9 g/dL (ref 30.0–36.0)
MCV: 91.7 fL (ref 80.0–100.0)
Monocytes Absolute: 0.8 10*3/uL (ref 0.1–1.0)
Monocytes Relative: 10 %
Neutro Abs: 5.8 10*3/uL (ref 1.7–7.7)
Neutrophils Relative %: 71 %
Platelets: 237 10*3/uL (ref 150–400)
RBC: 4.44 MIL/uL (ref 4.22–5.81)
RDW: 13.3 % (ref 11.5–15.5)
WBC: 8 10*3/uL (ref 4.0–10.5)
nRBC: 0 % (ref 0.0–0.2)

## 2020-03-30 LAB — COMPREHENSIVE METABOLIC PANEL
ALT: 22 U/L (ref 0–44)
AST: 24 U/L (ref 15–41)
Albumin: 3.4 g/dL — ABNORMAL LOW (ref 3.5–5.0)
Alkaline Phosphatase: 40 U/L (ref 38–126)
Anion gap: 12 (ref 5–15)
BUN: 20 mg/dL (ref 6–20)
CO2: 30 mmol/L (ref 22–32)
Calcium: 9.2 mg/dL (ref 8.9–10.3)
Chloride: 98 mmol/L (ref 98–111)
Creatinine, Ser: 0.95 mg/dL (ref 0.61–1.24)
GFR calc Af Amer: >60 mL/min (ref >60)
GFR calc non Af Amer: >60 mL/min (ref >60)
Glucose, Bld: 149 mg/dL — ABNORMAL HIGH (ref 70–99)
Potassium: 3.5 mmol/L (ref 3.5–5.1)
Sodium: 140 mmol/L (ref 135–145)
Total Bilirubin: 1.2 mg/dL (ref 0.3–1.2)
Total Protein: 6.9 g/dL (ref 6.5–8.1)

## 2020-03-30 LAB — GLUCOSE, CAPILLARY
Glucose-Capillary: 124 mg/dL — ABNORMAL HIGH (ref 70–99)
Glucose-Capillary: 143 mg/dL — ABNORMAL HIGH (ref 70–99)
Glucose-Capillary: 124 mg/dL — ABNORMAL HIGH (ref 70–99)
Glucose-Capillary: 181 mg/dL — ABNORMAL HIGH (ref 70–99)

## 2020-03-30 MED ORDER — BLOOD PRESSURE CONTROL BOOK
Freq: Once | Status: AC
  Filled 2020-03-30: qty 1

## 2020-03-30 MED ORDER — LIVING WELL WITH DIABETES BOOK
Freq: Once | Status: AC
  Filled 2020-03-30: qty 1

## 2020-03-30 MED ORDER — BISACODYL 10 MG RE SUPP: 10.0000 mg | Freq: Every day | RECTAL | Status: DC | PRN

## 2020-03-30 NOTE — Progress Notes (Signed)
Venous duplex       has been completed. Preliminary results can be found under CV proc through chart review. Jaspal Pultz, BS, RDMS, RVT   
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
## 2020-03-30 NOTE — Progress Notes (Signed)
Inpatient Rehabilitation  Patient information reviewed and entered into eRehab system by Harjot Dibello M. Braxson Hollingsworth, M.A., CCC/SLP, PPS Coordinator.  Information including medical coding, functional ability and quality indicators will be reviewed and updated through discharge.    

## 2020-03-30 NOTE — Progress Notes (Signed)
Occupational Therapy Session Note  Patient Details  Name: Manuel Fuller MRN: [id number] Date of Birth: 01-03-1961  Today's Date: 03/30/2020 OT Individual Time: 1300-1330 OT Individual Time Calculation (min): 30 min  and Today's Date: 03/30/2020 OT Missed Time: 43 Minutes Missed Time Reason: Pain   Short Term Goals: Week 1:  OT Short Term Goal 1 (Week 1): Pt will complete toilet transfer with mod A of 1 person OT Short Term Goal 2 (Week 1): Pt will complete 1 step of LB dressing task OT Short Term Goal 3 (Week 1): Pt will complete 1 step of upper body dressing task  Skilled Therapeutic Interventions/Progress Updates:  Balance/vestibular training;Community reintegration;Discharge planning;DME/adaptive equipment instruction;Functional mobility training;Patient/family education;Psychosocial support;Self Care/advanced ADL retraining;Therapeutic Exercise;Therapeutic Activities;UE/LE Strength taining/ROM;UE/LE Coordination activities;Wheelchair propulsion/positioning  Pt greeted semi-reclined in bed after lunch. Pt reported pain in ribs, but agreeable to try to stand at EOB. Pt needed HOB elevated and mod a to come to sitting EOB. Once at EOB pt needed rest break 2/2 pain. OT raised the bed slightly, verbal cues to keep L leg kicked out to stand, then Mod A for sit<>stand w/ RW. Pt tolerated standing for 30 seconds, despite encouragement to try to stand for 1 minute. Pt returned to sitting to rest. Pt reported pain in ribs at 10/10. OT encouraged pt to try standing again, but pt refused 2/2 pain. Pt reported need to return to supine. Pt needed OT assist to lift B LEs back into bed, but could manage trunk. OT reviewed OT goals and ways to progress more with mobility within session. Pt declined further participation and left semi-reclined in bed with bed alarm on, call bell in reach, and needs met.   Therapy Documentation Precautions:  Precautions Precautions: Fall Precaution Comments: left sided rib  fractures (ribs 3-9), manubrial fracture, NWB  LLE, chronic LBP Required Braces or Orthoses: Splint/Cast Splint/Cast: LLE soft splint Restrictions Weight Bearing Restrictions: Yes LLE Weight Bearing: Non weight bearing General: General OT Amount of Missed Time: 45 Minutes Pain: Pain Assessment Pain Scale: 0-10 Pain Score: 10 Pain Type: Acute pain Pain Location: Ribs, back Pain Orientation: Left Pain Descriptors / Indicators: Aching;Sharp;Stabbing Pain Onset: On-going Patients Stated Pain Goal: 0 Pain Intervention(s): Repositioned;Ambulation/increased activity;Distraction Multiple Pain Sites: No  Therapy/Group: Individual Therapy  Valma Cava 03/30/2020, 1:35 PM
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
## 2020-03-30 NOTE — Plan of Care (Signed)
  Problem: RH Balance Goal: LTG: Patient will maintain dynamic sitting balance (OT) Description: LTG:  Patient will maintain dynamic sitting balance with assistance during activities of daily living (OT) Flowsheets (Taken 03/30/2020 1242) LTG: Pt will maintain dynamic sitting balance during ADLs with: Independent Goal: LTG Patient will maintain dynamic standing with ADLs (OT) Description: LTG:  Patient will maintain dynamic standing balance with assist during activities of daily living (OT)  Flowsheets (Taken 03/30/2020 1242) LTG: Pt will maintain dynamic standing balance during ADLs with: Supervision/Verbal cueing   Problem: Sit to Stand Goal: LTG:  Patient will perform sit to stand in prep for activites of daily living with assistance level (OT) Description: LTG:  Patient will perform sit to stand in prep for activites of daily living with assistance level (OT) Flowsheets (Taken 03/30/2020 1242) LTG: PT will perform sit to stand in prep for activites of daily living with assistance level: Supervision/Verbal cueing   Problem: RH Bathing Goal: LTG Patient will bathe all body parts with assist levels (OT) Description: LTG: Patient will bathe all body parts with assist levels (OT) Flowsheets (Taken 03/30/2020 1242) LTG: Pt will perform bathing with assistance level/cueing: Supervision/Verbal cueing   Problem: RH Dressing Goal: LTG Patient will perform upper body dressing (OT) Description: LTG Patient will perform upper body dressing with assist, with/without cues (OT). Flowsheets (Taken 03/30/2020 1242) LTG: Pt will perform upper body dressing with assistance level of: Supervision/Verbal cueing Goal: LTG Patient will perform lower body dressing w/assist (OT) Description: LTG: Patient will perform lower body dressing with assist, with/without cues in positioning using equipment (OT) Flowsheets (Taken 03/30/2020 1242) LTG: Pt will perform lower body dressing with assistance level of: Supervision/Verbal  cueing   Problem: RH Toileting Goal: LTG Patient will perform toileting task (3/3 steps) with assistance level (OT) Description: LTG: Patient will perform toileting task (3/3 steps) with assistance level (OT)  Flowsheets (Taken 03/30/2020 1242) LTG: Pt will perform toileting task (3/3 steps) with assistance level: Supervision/Verbal cueing   Problem: RH Functional Use of Upper Extremity Goal: LTG Patient will use RT/LT upper extremity as a (OT) Description: LTG: Patient will use right/left upper extremity as a stabilizer/gross assist/diminished/nondominant/dominant level with assist, with/without cues during functional activity (OT) Flowsheets (Taken 03/30/2020 1242) LTG: Use of upper extremity in functional activities: LUE as nondominant level   Problem: RH Toilet Transfers Goal: LTG Patient will perform toilet transfers w/assist (OT) Description: LTG: Patient will perform toilet transfers with assist, with/without cues using equipment (OT) Flowsheets (Taken 03/30/2020 1242) LTG: Pt will perform toilet transfers with assistance level of: Supervision/Verbal cueing   Problem: RH Tub/Shower Transfers Goal: LTG Patient will perform tub/shower transfers w/assist (OT) Description: LTG: Patient will perform tub/shower transfers with assist, with/without cues using equipment (OT) Flowsheets (Taken 03/30/2020 1242) LTG: Pt will perform tub/shower stall transfers with assistance level of: Supervision/Verbal cueing

## 2020-03-31 LAB — GLUCOSE, CAPILLARY
Glucose-Capillary: 125 mg/dL — ABNORMAL HIGH (ref 70–99)
Glucose-Capillary: 118 mg/dL — ABNORMAL HIGH (ref 70–99)
Glucose-Capillary: 149 mg/dL — ABNORMAL HIGH (ref 70–99)
Glucose-Capillary: 150 mg/dL — ABNORMAL HIGH (ref 70–99)
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
## 2020-04-01 ENCOUNTER — Inpatient Hospital Stay (HOSPITAL_COMMUNITY): Payer: BC Managed Care – PPO

## 2020-04-01 ENCOUNTER — Inpatient Hospital Stay (HOSPITAL_COMMUNITY): Payer: BC Managed Care – PPO | Admitting: Occupational Therapy

## 2020-04-01 LAB — GLUCOSE, CAPILLARY
Glucose-Capillary: 134 mg/dL — ABNORMAL HIGH (ref 70–99)
Glucose-Capillary: 165 mg/dL — ABNORMAL HIGH (ref 70–99)
Glucose-Capillary: 127 mg/dL — ABNORMAL HIGH (ref 70–99)
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
## 2020-04-01 NOTE — Progress Notes (Signed)
Occupational Therapy Session Note  Patient Details  Name: Manuel Fuller MRN: [id number] Date of Birth: April 08, 1961  Today's Date: 04/01/2020 OT Individual Time: 3762-8315 and 1104-1200 OT Individual Time Calculation (min): 73 min and 56 min  Short Term Goals: Week 1:  OT Short Term Goal 1 (Week 1): Pt will complete toilet transfer with mod A of 1 person OT Short Term Goal 2 (Week 1): Pt will complete 1 step of LB dressing task OT Short Term Goal 3 (Week 1): Pt will complete 1 step of upper body dressing task  Skilled Therapeutic Interventions/Progress Updates:    Pt greeted in bed, requesting to engage in bathing/dressing tasks. OT placed pt in chair position bedlevel for mgt of rib pain. RN in during session to provide pain medicine as well. He completed UB bathing with setup assistance, also able to wash thighs bilaterally. Assistance for washing Rt foot, frontal + posterior periareas. Used the reacher for pt to thread LEs into pants comfortably. OT held his Lt LE for NWB during 1 legged bridging to start pulling pants up, pt ultimately needing assistance for fully elevating pants via rolling Rt>Lt with Min A. Mod A for pulling shirt down over trunk with pt rolling once again with the same assistance. Rest breaks required during bed mobility due to increased rib pain. OT then set pt up with hand sanitizer and oral care with HOB elevated. He remained in bed with all needs within reach, reporting feeling "a whole lot better" after washing up. Bed alarm set. Tx focus placed on activity tolerance, pain mgt, functional activity engagement while adhering to precautions, and adaptive self care skills.   2nd Session 1:1 tx (56 min) Pt greeted in bed, premedicated for pain. Supine<sit with HOB elevated completed with CGA! We celebrated! While sitting pt changed into a hospital gown with assistance due to increased rib pain with back unsupported. Sit<stand with RW completed with Min A where pt was able to  stand for ~27 seconds, Lt foot forward during sit<stand, stand, and pt even able to lift foot off of floor for a little bit. After he sat back down, pt transferred to the w/c using RW, able to hop forward and towards the side but needed OT to pull w/c up behind him because he was unable to hop backwards. Pt was then escorted to the outdoor patio via w/c. Worked on UB strengthening by self propelling w/c over uneven surfaces. Pt required both armrests to be removed to increase comfort, small pushes vs larger efficient ones to accommodate rib pain. Increased time and max exertion for pt to navigate w/c up small inclines, he did well with breathing out with exertion during these times. At end of session pt was returned to his room via w/c and agreeable to remain sitting up in the w/c. Left him with all needs within reach.   Therapy Documentation Precautions:  Precautions Precautions: Fall Precaution Comments: left sided rib fractures (ribs 3-9), manubrial fracture, NWB  LLE, chronic LBP Required Braces or Orthoses: Splint/Cast Splint/Cast: LLE soft splint Restrictions Weight Bearing Restrictions: Yes LLE Weight Bearing: Non weight bearing Pain Assessment Pain Scale: 0-10 Pain Score: 10-Worst pain ever ADL: ADL Eating: Set up Grooming: Setup Upper Body Bathing: Maximal assistance Lower Body Bathing: Maximal assistance Upper Body Dressing: Maximal assistance Lower Body Dressing: Maximal assistance Toileting: Maximal assistance Toilet Transfer: Maximal assistance Tub/Shower Transfer: Unable to assess     Therapy/Group: Individual Therapy  Prajna Vanderpool A Amparo Donalson 04/01/2020, 12:06 PM

## 2020-04-02 ENCOUNTER — Inpatient Hospital Stay (HOSPITAL_COMMUNITY): Payer: BC Managed Care – PPO | Admitting: Physical Therapy

## 2020-04-02 ENCOUNTER — Inpatient Hospital Stay (HOSPITAL_COMMUNITY): Payer: BC Managed Care – PPO | Admitting: Occupational Therapy

## 2020-04-02 ENCOUNTER — Inpatient Hospital Stay (HOSPITAL_COMMUNITY): Payer: BC Managed Care – PPO

## 2020-04-02 LAB — GLUCOSE, CAPILLARY
Glucose-Capillary: 118 mg/dL — ABNORMAL HIGH (ref 70–99)
Glucose-Capillary: 137 mg/dL — ABNORMAL HIGH (ref 70–99)
Glucose-Capillary: 154 mg/dL — ABNORMAL HIGH (ref 70–99)
Glucose-Capillary: 132 mg/dL — ABNORMAL HIGH (ref 70–99)
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
## 2020-04-02 NOTE — Care Management (Signed)
Inpatient Rehabilitation Center Individual Statement of Services  Patient Name:  Manuel Fuller  Date:  04/02/2020  Welcome to the Inpatient Rehabilitation Center.  Our goal is to provide you with an individualized program based on your diagnosis and situation, designed to meet your specific needs.  With this comprehensive rehabilitation program, you will be expected to participate in at least 3 hours of rehabilitation therapies Monday-Friday, with modified therapy programming on the weekends.  Your rehabilitation program will include the following services:  Physical Therapy (PT), Occupational Therapy (OT), 24 hour per day rehabilitation nursing, Therapeutic Recreaction (TR), Psychology, Neuropsychology, Care Coordinator, Rehabilitation Medicine, Nutrition Services, Pharmacy Services and Other  Weekly team conferences will be held on Tuesdays to discuss your progress.  Your Inpatient Rehabilitation Care Coordinator will talk with you frequently to get your input and to update you on team discussions.  Team conferences with you and your family in attendance may also be held.  Expected length of stay: 2-2.5 weeks    Overall anticipated outcome: Supervision  Depending on your progress and recovery, your program may change. Your Inpatient Rehabilitation Care Coordinator will coordinate services and will keep you informed of any changes. Your Inpatient Rehabilitation Care Coordinator's name and contact numbers are listed  below.  The following services may also be recommended but are not provided by the Inpatient Rehabilitation Center:   Driving Evaluations  Home Health Rehabiltiation Services  Outpatient Rehabilitation Services  Vocational Rehabilitation   Arrangements will be made to provide these services after discharge if needed.  Arrangements include referral to agencies that provide these services.  Your insurance has been verified to be:  BCBS  Your primary doctor is:  Programme researcher, broadcasting/film/video  Pertinent information will be shared with your doctor and your insurance company.  Inpatient Rehabilitation Care Coordinator:  Susie Cassette [phone] or [phone]  Information discussed with and copy given to patient by: Gretchen Short, 04/02/2020, 11:26 AM
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
## 2020-04-02 NOTE — Progress Notes (Signed)
Occupational Therapy Session Note  Patient Details  Name: MAKIH STEFANKO MRN: [id number] Date of Birth: September 10, 1961  Today's Date: 04/02/2020 OT Individual Time: 2706-2376 OT Individual Time Calculation (min): 28 min    Short Term Goals: Week 1:  OT Short Term Goal 1 (Week 1): Pt will complete toilet transfer with mod A of 1 person OT Short Term Goal 2 (Week 1): Pt will complete 1 step of LB dressing task OT Short Term Goal 3 (Week 1): Pt will complete 1 step of upper body dressing task  Skilled Therapeutic Interventions/Progress Updates:    1;1. Pt received in w/c reporting significant pain in ribs requesting to get back in bed, despite repositioning with pillow, but pt reporting back of w/c needs to be higher. Pt completes stand pivot transfer back to bed with MIN A Overall and VC for Rw management. Pt completes bed mobility with S overall and OT searches for higher back 22x18 with no success. Brought thinner w/c cushion to see if that allows for more back support. Also educated on sitting in recliner with w/c cushion to provide higher back with pillow under back half of cushion (pt doesn't like dumped nature of the recliner). Pt verbalized willingness to try later in the day. Pt declines further intervention at this time after provided ice for back. Exited session with pt seated in bed, exit alarm on and call light in reach  Therapy Documentation Precautions:  Precautions Precautions: Fall Precaution Comments: left sided rib fractures (ribs 3-9), manubrial fracture, NWB  LLE, chronic LBP Required Braces or Orthoses: Splint/Cast Splint/Cast: LLE soft splint Restrictions Weight Bearing Restrictions: Yes LLE Weight Bearing: Non weight bearing General:   Vital Signs:   Pain:   ADL: ADL Eating: Set up Grooming: Setup Upper Body Bathing: Maximal assistance Lower Body Bathing: Maximal assistance Upper Body Dressing: Maximal assistance Lower Body Dressing: Maximal  assistance Toileting: Maximal assistance Toilet Transfer: Maximal assistance Tub/Shower Transfer: Unable to assess Vision   Perception    Praxis   Exercises:   Other Treatments:     Therapy/Group: Individual Therapy  Shon Hale 04/02/2020, 12:18 PM

## 2020-04-02 NOTE — Plan of Care (Signed)
D/c goal 2/2 pt reporting he plans to sleep in recliner at home.  Problem: RH Bed Mobility Goal: LTG Patient will perform bed mobility with assist (PT) Description: LTG: Patient will perform bed mobility with assistance, with/without cues (PT). Outcome: Adequate for Discharge Flowsheets (Taken 04/02/2020 0943) LTG: Pt will perform bed mobility with assistance level of: (d/c goal - pt plans to sleep in recliner at home) -- Note: d/c goal - pt plans to sleep in recliner at home

## 2020-04-02 NOTE — Progress Notes (Signed)
Occupational Therapy Session Note  Patient Details  Name: Manuel Fuller MRN: [id number] Date of Birth: 02-27-1961  Today's Date: 04/02/2020 OT Individual Time: 1700-1740 OT Individual Time Calculation (min): 40 min missed 20 minutes due to complaints of fatigue andpain  Short Term Goals: Week 1:  OT Short Term Goal 1 (Week 1): Pt will complete toilet transfer with mod A of 1 person OT Short Term Goal 2 (Week 1): Pt will complete 1 step of LB dressing task OT Short Term Goal 3 (Week 1): Pt will complete 1 step of upper body dressing task  Skilled Therapeutic Interventions/Progress Updates: patient stated he had ust gotten situated in his recliner chair from his previous session and was in pain and exhausted but that he wanted to "try to fight through it.  The nurse just gave me some pain medicine."   Patient was very very cordial and polite.   He worked on functional transfers and intended to practice w/e to/fr shower bench transfer, but he stated his ribs were hurting just to much to do that much mvmement.   So, he resolved to a couple recliner to/fr w/c transfers (close supervision) and side steps.   He maintained LLE NWB.  Otherwise, he also worked on bilateral upper extremity stretching and range of motion inattempt to help decrease tightness and pain in bilaterally ribs, but especially left posterior ribs.   While AAROM and PROM stretching he noted tightness or some pain in what he described as his lattisum and trapezius muslces area.  He was comfortabled with water and positioning in his recliner chair at the end of the session and resumed a missed phone call.   His call bell and table were placed within reach.  [place]ointinue OT plan of care     Therapy Documentation Precautions:  Precautions Precautions: Fall Precaution Comments: left sided rib fractures (ribs 3-9), manubrial fracture, NWB  LLE, chronic LBP Required Braces or Orthoses: Splint/Cast Splint/Cast: LLE soft  splint Restrictions Weight Bearing Restrictions: Yes LLE Weight Bearing: Non weight bearing General: General OT Amount of Missed Time: 20 Minutes    Pain:4/10 constant in (my ribs in back on the left)  RN had just givenpain meds  Vision   Perception    Praxis   Exercises:   Other Treatments:     Therapy/Group: Individual Therapy  [name] [facility] 04/02/2020, 6:32 PM

## 2020-04-02 NOTE — Progress Notes (Signed)
Physical Therapy Session Note  Patient Details  Name: Manuel Fuller MRN: [id number] Date of Birth: October 31, 1960  Today's Date: 04/02/2020 PT Individual Time: 7829-5621 and 3086-5784 PT Individual Time Calculation (min): 70 min and 25 min  Short Term Goals: Week 1:  PT Short Term Goal 1 (Week 1): Patient to be able to perform bed mobility with MaxA of 1 and without use of bed features PT Short Term Goal 2 (Week 1): Patient to be able to perform functional transfers with no more than MinAx2 PT Short Term Goal 3 (Week 1): Patient to be able to self propel WC at least 47ft with S PT Short Term Goal 4 (Week 1): Patient to be able to gait train at least 41ft with min guard/RW  Skilled Therapeutic Interventions/Progress Updates:  Treatment 1: Pt received in bed & agreeable to tx. Pt transfers supine>sit with supervision & hospital bed features, noting dizziness that quickly passes with rest once sitting EOB. Reviewed NWB LLE and pt transfers sit<>stand with CGA with ongoing cuing re: hand placement for increased safety. Pt completes stand pivot with CGA & RW but is unable to perform retrograde gait to w/c so therapist adjust's w/c position. Discussed d/c plans & pt reports he will ride home in a friend's car. Transported pt to gym via w/c dependent assist. Gait x 7 ft + 7 ft + 3 ft with RW & CGA + w/c follow for safety. Therapist provides demo & education re: use of BUE to assist with lifting LE vs hopping solely on RLE. Pt with improving foot clearance but will benefit from further practice as pt will ambulate over carpet in his mother's home. Discussed use of RW vs w/c in home, with pt able to use w/c if needed in open areas & use RW to access bathroom. Pt unable to tolerate sitting without back support for prolonged time 2/2 rib pain. Provided pt with 22x18 w/c & discussed DME recommendations. At end of session pt left in w/c in room in care of nurse.  Pain: 5/10 rib pain, rest breaks provided  PRN   Treatment 2: Pt received in bed & agreeable to tx. Pt reports need to void & therapist provides total assist for urinal management & pt with continent void. Pt transfers supine>sit with supervision, extra time & hospital bed features. Sit<>stand with CGA from EOB & recliner with RW. Gait x 4 ft + 6 ft + 4 ft with RW & CGA with pt demonstrating increased effort of pushing up through arms vs hopping on RLE. Pt is limited in gait by pain & fatigue. At end of session pt left in recliner with call bell in reach, chair alarm donned. Pain: 9/10 in ribs, rest breaks provided, & pain meds requested   Therapy Documentation Precautions:  Precautions Precautions: Fall Precaution Comments: left sided rib fractures (ribs 3-9), manubrial fracture, NWB  LLE, chronic LBP Required Braces or Orthoses: Splint/Cast Splint/Cast: LLE soft splint Restrictions Weight Bearing Restrictions: Yes LLE Weight Bearing: Non weight bearing    Therapy/Group: Individual Therapy  Sandi Mariscal 04/02/2020, 4:22 PM

## 2020-04-03 ENCOUNTER — Inpatient Hospital Stay (HOSPITAL_COMMUNITY): Payer: BC Managed Care – PPO | Admitting: Occupational Therapy

## 2020-04-03 ENCOUNTER — Inpatient Hospital Stay (HOSPITAL_COMMUNITY): Payer: BC Managed Care – PPO | Admitting: Physical Therapy

## 2020-04-03 LAB — GLUCOSE, CAPILLARY
Glucose-Capillary: 118 mg/dL — ABNORMAL HIGH (ref 70–99)
Glucose-Capillary: 115 mg/dL — ABNORMAL HIGH (ref 70–99)
Glucose-Capillary: 137 mg/dL — ABNORMAL HIGH (ref 70–99)
Glucose-Capillary: 127 mg/dL — ABNORMAL HIGH (ref 70–99)

## 2020-04-03 MED ORDER — SENNOSIDES-DOCUSATE SODIUM 8.6-50 MG PO TABS
2.0000 | ORAL_TABLET | Freq: Every day | ORAL | Status: DC
  Administered 2020-04-04 – 2020-04-11 (×8): 2 via ORAL
  Filled 2020-04-03 (×9): qty 2

## 2020-04-03 MED ORDER — DIPHENHYDRAMINE HCL 25 MG PO CAPS
25.0000 mg | ORAL_CAPSULE | Freq: Four times a day (QID) | ORAL | Status: DC | PRN
  Administered 2020-04-03 – 2020-04-04 (×3): 25 mg via ORAL
  Filled 2020-04-03 (×3): qty 1

## 2020-04-03 MED ORDER — MAGNESIUM CITRATE PO SOLN
1.0000 | Freq: Once | ORAL | Status: AC
  Administered 2020-04-03: 1 via ORAL
  Filled 2020-04-03: qty 296

## 2020-04-03 MED ORDER — HYDROCORTISONE 1 % EX CREA
TOPICAL_CREAM | Freq: Three times a day (TID) | CUTANEOUS | Status: DC
  Filled 2020-04-03 (×3): qty 28

## 2020-04-03 NOTE — Progress Notes (Signed)
Patient stated he would take the magnesium Citrate, but wanted to wait until after therapy and dinner.
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
## 2020-04-03 NOTE — Progress Notes (Signed)
Patient noted with a rash today on his back, but writer noted it spreading to his bilateral arms/ upper shoulder area. Patient has received benadryl PRN and scheduled cream, the medication has been helping the itch but the rash is still noticeable.

## 2020-04-03 NOTE — Progress Notes (Signed)
Occupational Therapy Session Note  Patient Details  Name: Manuel Fuller MRN: [id number] Date of Birth: 17-Mar-1961  Today's Date: 04/03/2020 OT Individual Time: 0705-0800 OT Individual Time Calculation (min): 55 min    Short Term Goals: Week 1:  OT Short Term Goal 1 (Week 1): Pt will complete toilet transfer with mod A of 1 person OT Short Term Goal 2 (Week 1): Pt will complete 1 step of LB dressing task OT Short Term Goal 3 (Week 1): Pt will complete 1 step of upper body dressing task  Skilled Therapeutic Interventions/Progress Updates:    Upon entering the room, pt supine in bed and reports pain medication given prior to OT arrival. Pt is agreeable to OT intervention this session. Supine >sit with min A to EOB. Pt standing and transferring with CGA into wheelchair and positioned at sink for self care tasks. Pt able to brush teeth while seated in wheelchair. Pt reports discomfort with low back chair and therapist placing thin wheelchair cushion behind pt temporarily for increased support. Pt washing UB with set up A and noted rash forming with RN being notified. Pt able to stand for several minutes at sink with CGA while therapist assisted with washing buttocks and peri area secondary to pt's body habitus. Pt threading pants onto B feet with min cuing for use of LH reacher. Pt standing to pull pants up and transferred into recliner chair with CGA at end of session. Call bell and all needed items within reach upon exiting the room.   Therapy Documentation Precautions:  Precautions Precautions: Fall Precaution Comments: left sided rib fractures (ribs 3-9), manubrial fracture, NWB  LLE, chronic LBP Required Braces or Orthoses: Splint/Cast Splint/Cast: LLE soft splint Restrictions Weight Bearing Restrictions: Yes LLE Weight Bearing: Non weight bearing Pain: Pain Assessment Pain Score: 2  ADL: ADL Eating: Set up Grooming: Setup Upper Body Bathing: Maximal assistance Lower Body Bathing:  Maximal assistance Upper Body Dressing: Maximal assistance Lower Body Dressing: Maximal assistance Toileting: Maximal assistance Toilet Transfer: Maximal assistance Tub/Shower Transfer: Unable to assess   Therapy/Group: Individual Therapy  Alen Bleacher 04/03/2020, 12:54 PM

## 2020-04-03 NOTE — Progress Notes (Addendum)
Physical Therapy Note  Patient Details  Name: Manuel Fuller MRN: [id number] Date of Birth: 05-Feb-1961 Today's Date: 04/04/2020    Recommending the following equipment to increase functional independence with mobility:  - manual 22x18 w/c with elevating leg rests.    Patient measurements:    Hip Width: 20 inches    Seat Depth (-2"): 20    Back Height: 18 inches (buttocks to top of axillary region)   W/c cushion: standard, 22x18    - Double wheeled RW 2/2 body habitus    Sandi Mariscal 04/04/2020, 9:38 AM

## 2020-04-03 NOTE — Progress Notes (Signed)
Occupational Therapy Session Note  Patient Details  Name: Manuel Fuller MRN: [id number] Date of Birth: 12-23-1960  Today's Date: 04/03/2020 OT Individual Time: 1450-1530 OT Individual Time Calculation (min): 40 min    Short Term Goals: Week 1:  OT Short Term Goal 1 (Week 1): Pt will complete toilet transfer with mod A of 1 person OT Short Term Goal 2 (Week 1): Pt will complete 1 step of LB dressing task OT Short Term Goal 3 (Week 1): Pt will complete 1 step of upper body dressing task  Skilled Therapeutic Interventions/Progress Updates:    Treatment session with focus on sit > stand and functional transfers.  Pt received fully reclined in recliner reporting "in a bind".  Provided assistance to sit upright in recliner with pt reporting improved positioning.  Pt reports pain in ribs/along back 8/10.  Discussed potential for PRN medication, pt declining but stating he would feel better in bed.  Engaged in sit > stand x4 from recliner with CGA.  Pt completed short ambulatory transfer to bed with CGA and cues for turning to approach EOB.  Pt able to bring BLE in to bed with momentum of lying down.  Pt reports pain relief with pressure of bed along back.  Discussed home entrance and tub/shower vs walk-in shower setup at his mother's home.  Pt reports plans to complete shower transfers during OT session tomorrow.  Pt demonstrating improved mobility compared to previous sessions despite pain.  Remained semi-reclined in bed with all needs in reach.  Therapy Documentation Precautions:  Precautions Precautions: Fall Precaution Comments: left sided rib fractures (ribs 3-9), manubrial fracture, NWB  LLE, chronic LBP Required Braces or Orthoses: Splint/Cast Splint/Cast: LLE soft splint Restrictions Weight Bearing Restrictions: Yes LLE Weight Bearing: Non weight bearing General:   Vital Signs: Therapy Vitals Temp: 98.1 F (36.7 C) Pulse Rate: 67 Resp: 17 BP: 110/67 Patient Position (if  appropriate): Lying Oxygen Therapy SpO2: 96 % O2 Device: Room Air Pain: Pt with c/o pain 8/10 in Lt ribs where they wrap around back.  Repositioned, declined any medication at this time.  Therapy/Group: Individual Therapy  Rosalio Loud 04/03/2020, 3:34 PM
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
## 2020-04-03 NOTE — Progress Notes (Addendum)
Physical Therapy Session Note  Patient Details  Name: Manuel Fuller MRN: [id number] Date of Birth: 01-07-1961  Today's Date: 04/03/2020 PT Individual Time: 7616-0737 and 1062-6948 PT Individual Time Calculation (min): 58 min and 41 min  Short Term Goals: Week 1:  PT Short Term Goal 1 (Week 1): Patient to be able to perform bed mobility with MaxA of 1 and without use of bed features PT Short Term Goal 2 (Week 1): Patient to be able to perform functional transfers with no more than MinAx2 PT Short Term Goal 3 (Week 1): Patient to be able to self propel WC at least 11ft with S PT Short Term Goal 4 (Week 1): Patient to be able to gait train at least 84ft with min guard/RW  Skilled Therapeutic Interventions/Progress Updates:  Treatment 1: Pt received in recliner & agreeable to tx. Pt with c/o 4/10 posterior rib pain but is premedicated & rest breaks provided PRN. Sit<>stand throughout session from recliner, w/c, and EOM with CGA & RW. Gait x 6 ft + 6 ft + 7 ft with RW & CGA with ongoing min cuing for increased RLE foot clearance vs scooting across floor. Pt fatigues quickly with all gait activities. Attempted ball taps with 4# weighted bar but activity ended 2/2 increased rib pain. Pt performed modified sit ups from w/c level with 1kg ball, 3 sets x 10 reps for core strengthening. At end of session pt returns to bed with supervision for sit>supine & hospital bed features. Pt left in bed with alarm set, call bell & all needs in reach.  Treatment 2: Pt received in bed & agreeable to tx. Pt's friend present with mask below nose & therapist re-educated him on masking policy & he adjusted it. Discussed DME recommendations (w/c with ELR & RW vs no knee scooter 2/2 decreased safety with that AD). Also educated pt & friend (who will be taking him home) on recommendation to transfer car>w/c, use w/c to get to door of house, then ambulate through level doorway with RW since w/c will not fit through doorway 2/2  width (can then fold up w/c to get it in/out of house for pt to use it indoors). Pt & friend both voice understanding. Pt transfers sit<>stand from recliner & w/c with CGA & RW with ongoing cuing to maintain NWB LLE. Stand pivot to w/c with CGA & RW. In gym, pt completes stand pivot w/c<>car at sedan simulated height with RW & CGA with therapist providing instructional cuing for overall transfer technique and to not hold to door for stability. Pt is able to place BLE in/out of car without assistance. Back in w/c pt propels w/c short distance (~20 ft) with BUE & supervision with significantly extra time. Back in room, pt transfers w/c>recliner via stand pivot with RW & CGA. Pt left in recliner with chair alarm donned, call bell & all needs in reach.   Pain: 8/10 posterior L ribs, rest breaks provided PRN & pt reports he's premedicated.   Therapy Documentation Precautions:  Precautions Precautions: Fall Precaution Comments: left sided rib fractures (ribs 3-9), manubrial fracture, NWB  LLE, chronic LBP Required Braces or Orthoses: Splint/Cast Splint/Cast: LLE soft splint Restrictions Weight Bearing Restrictions: Yes LLE Weight Bearing: Non weight bearing    Therapy/Group: Individual Therapy  Sandi Mariscal 04/03/2020, 3:24 PM

## 2020-04-03 NOTE — Progress Notes (Signed)
Patient ID: Manuel Fuller, male   DOB: [birth year], 59 y.o.   MRN: [id number]  SW met with pt to provide updates from team conference, and d/c date 04/12/2020. SW will continue to provide updates as available.   Loralee Pacas, MSW, [place] Office: [phone] Cell: [phone] Fax: [phone]

## 2020-04-04 ENCOUNTER — Inpatient Hospital Stay (HOSPITAL_COMMUNITY): Payer: BC Managed Care – PPO | Admitting: Physical Therapy

## 2020-04-04 ENCOUNTER — Inpatient Hospital Stay (HOSPITAL_COMMUNITY): Payer: BC Managed Care – PPO | Admitting: Occupational Therapy

## 2020-04-04 LAB — GLUCOSE, CAPILLARY
Glucose-Capillary: 123 mg/dL — ABNORMAL HIGH (ref 70–99)
Glucose-Capillary: 93 mg/dL (ref 70–99)
Glucose-Capillary: 121 mg/dL — ABNORMAL HIGH (ref 70–99)
Glucose-Capillary: 136 mg/dL — ABNORMAL HIGH (ref 70–99)

## 2020-04-04 NOTE — Progress Notes (Signed)
Occupational Therapy Session Note  Patient Details  Name: Manuel Fuller MRN: [id number] Date of Birth: 1961-07-14  Today's Date: 04/04/2020 OT Individual Time: 4098-1191 OT Individual Time Calculation (min): 70 min    Short Term Goals: Week 1:  OT Short Term Goal 1 (Week 1): Pt will complete toilet transfer with mod A of 1 person OT Short Term Goal 2 (Week 1): Pt will complete 1 step of LB dressing task OT Short Term Goal 3 (Week 1): Pt will complete 1 step of upper body dressing task  Skilled Therapeutic Interventions/Progress Updates:    Upon entering the room, pt supine in bed with c/o 6/10 generalized pain but agreeable to OT intervention. Pt performing supine >sit with CGA to EOB. Min A stand pivot transfer into wheelchair. OT assisted pt into bathroom via wheelchair. Stand pivot transfer in tight space with RW and min A onto TTB. Pt bathing at shower level with min A to wash buttocks. Pt able to utilize LH sponge to increase independence with self care tasks. Pt taking extra time to run how water over "itchy places" and reports it feels much better. Pt returning to sit in wheelchair in same manner. Pt reports pain increasing at this point and requests to return to bed. Pt utilized reacher to thread pants onto B feet and standing with min A while therapist pulls over B hips. OT offered other suggestions such as lateral leans but pt declines and states, " My mother can help me at home". Pt returning to sit on EOB with min A and sit >supine with CGA. Pt positioned in bed for comfort with all needs within reach. Call bell within reach as therapist exits the room.   Therapy Documentation Precautions:  Precautions Precautions: Fall Precaution Comments: left sided rib fractures (ribs 3-9), manubrial fracture, NWB  LLE, chronic LBP Required Braces or Orthoses: Splint/Cast Splint/Cast: LLE soft splint Restrictions Weight Bearing Restrictions: Yes LLE Weight Bearing: Non weight bearing     ADL: ADL Eating: Set up Grooming: Setup Upper Body Bathing: Maximal assistance Lower Body Bathing: Maximal assistance Upper Body Dressing: Maximal assistance Lower Body Dressing: Maximal assistance Toileting: Maximal assistance Toilet Transfer: Maximal assistance Tub/Shower Transfer: Unable to assess   Therapy/Group: Individual Therapy  Alen Bleacher 04/04/2020, 12:46 PM
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
## 2020-04-04 NOTE — Progress Notes (Signed)
Physical Therapy Session Note  Patient Details  Name: Manuel Fuller MRN: [id number] Date of Birth: 09-Mar-1961  Today's Date: 04/04/2020 PT Individual Time: 1027-2536 and 1310-1403 PT Individual Time Calculation (min): 75 min and 53 min  Short Term Goals: Week 1:  PT Short Term Goal 1 (Week 1): Patient to be able to perform bed mobility with MaxA of 1 and without use of bed features PT Short Term Goal 2 (Week 1): Patient to be able to perform functional transfers with no more than MinAx2 PT Short Term Goal 3 (Week 1): Patient to be able to self propel WC at least 54ft with S PT Short Term Goal 4 (Week 1): Patient to be able to gait train at least 59ft with min guard/RW  Skilled Therapeutic Interventions/Progress Updates:  Treatment 1: Pt received in bed & agreeable to tx. Supine>sit with supervision & hospital bed features. Therapist measured pt for appropriate sized w/c (22x18 with ELR). Sit<>stand and stand pivot bed>w/c with RW & close supervision. Gait x 16 ft + 13 ft with RW & close supervision with pt demonstrating improving endurance and ability to lift body with BUE to decrease hopping on RLE. Pt returns to bed, sit>supine with supervision & hospital bed features. Provided pt with supine/sitting HEP handouts. Pt performed the following LLE exercises: quad sets, heel slides, short arc quads, hip abduction, straight leg raises, seated hip flexion & seated long arc quads. Therapist provides education & instructional cuing for techniques. Discussed appropriate footwear to use at home & progression of HEP exercises (using 2.5-5# ankle weights). At end of session pt left in bed with call bell & all needs in reach, bed alarm set.  Pain: 4-5/10 in posterior ribs at rest, 8-9/10 with movement, rest breaks provided PRN & pt reports he's premedicated   Treatment 2: Pt received in bed & agreeable to tx. Supine>sit with supervision & hospital bed features with pt reporting dizziness that ends within ~2  minutes, vitals below. Therapist dons R tennis shoes total assist and pt transfers sit>stand with min assist 2/2 R lateral LOB, stand pivot to w/c with CGA & RW. In apartment, pt ambulates 7 ft with RW & min assist with tennis shoe on to simulate household mobility over carpet with pt ultimately requiring therapist to position w/c for him to seat early, before returning to it. Pt verbalizes he understands increased difficulty of ambulating over carpet & reports he will likely go barefoot in the house. Stand pivot w/c<>nu-step with RW & CGA with max cuing to square up to nu-step seat as pt is off center. Nu-step on level 4 x 10 minutes with BUE & RLE for cardiopulmonary endurance training & general strengthening; pt reports 13/20 on Borg RPE scale during activity. Pt reports he will have a lift chair at home & therapist educated him to only use it to make sitting surface flat vs dumped and to not use lift portion to help him sit>stand with pt reporting understanding. Back in room pt transfers w/c>recliner with RW & CGA for ~3 steps. Reviewed use of incentive spirometer with pt. Pt left in recliner with BLE elevated, chair alarm donned & call bell & all needs in reach. BP = 112/69 mmHg, HR = 73 bpm Pain: 7-8/10, pt is premedicated & rest breaks provided PRN  Therapy Documentation Precautions:  Precautions Precautions: Fall Precaution Comments: left sided rib fractures (ribs 3-9), manubrial fracture, NWB  LLE, chronic LBP Required Braces or Orthoses: Splint/Cast Splint/Cast: LLE soft splint Restrictions Weight  Bearing Restrictions: Yes LLE Weight Bearing: Non weight bearing      Therapy/Group: Individual Therapy  Sandi Mariscal 04/04/2020, 2:05 PM

## 2020-04-04 NOTE — Progress Notes (Signed)
Physical Therapy Weekly Progress Note  Patient Details  Name: Manuel Fuller MRN: [id number] Date of Birth: 1960/11/28  Beginning of progress report period: March 30, 2020 End of progress report period: April 04, 2020  Today's Date: 04/04/2020   Patient has met 2 of 4 short term goals.  Pt is making slow progress towards independence with functional mobility. Pt is able to ambulate a max of 15 ft with an average of 7 ft with RW & CGA progressing to close supervision and complete transfers with CGA<>supervision. Pt demonstrates decreased cardiopulmonary endurance and fatigues quickly with gait. Pt is also very limited by posterior rib pain with all movement. It is anticipated that pt will be a very limited household ambulator and will otherwise use w/c in spaces that will accommodate it in the house. Pt would benefit from continued skilled PT treatment to increase strength, endurance, and progress gait as well as for pt/family education & safety awareness prior to d/c. Pt would benefit from HHPT services but if unable to get them, would recommend waiting on OPPT until weight bearing restrictions have been modified by MD. Pt has been provided with HEP exercises to perform at home to maintain LLE strength.  Patient continues to demonstrate the following deficits muscle weakness, decreased cardiorespiratoy endurance and decreased standing balance, decreased balance strategies and difficulty maintaining precautions and therefore will continue to benefit from skilled PT intervention to increase functional independence with mobility.  Patient slowly progressing toward long term goals..  LTG's modified 2/2 inconsistent progress (decreased gait & w/c mobility distances).  PT Short Term Goals Week 1:  PT Short Term Goal 1 (Week 1): Patient to be able to perform bed mobility with MaxA of 1 and without use of bed features PT Short Term Goal 1 - Progress (Week 1): Met PT Short Term Goal 2 (Week 1): Patient to be able  to perform functional transfers with no more than MinAx2 PT Short Term Goal 2 - Progress (Week 1): Met PT Short Term Goal 3 (Week 1): Patient to be able to self propel WC at least 44f with S PT Short Term Goal 3 - Progress (Week 1): Not met PT Short Term Goal 4 (Week 1): Patient to be able to gait train at least 113fwith min guard/RW PT Short Term Goal 4 - Progress (Week 1): Progressing toward goal Week 2:  PT Short Term Goal 1 (Week 2): STG = LTG 2/2 ELOS.  Therapy Documentation Precautions:  Precautions Precautions: Fall Precaution Comments: left sided rib fractures (ribs 3-9), manubrial fracture, NWB  LLE, chronic LBP Required Braces or Orthoses: Splint/Cast Splint/Cast: LLE soft splint Restrictions Weight Bearing Restrictions: Yes LLE Weight Bearing: Non weight bearing   Therapy/Group: Individual Therapy  ViWaunita Schooner/03/2020, 4:37 PM

## 2020-04-04 NOTE — Plan of Care (Signed)
Goals modified 2/2 slow progress.  Problem: RH Wheelchair Mobility Goal: LTG Patient will propel w/c in community environment (PT) Description: LTG: Patient will propel wheelchair in community environment, # of feet with assist (PT) Outcome: Not Applicable Flowsheets (Taken 04/04/2020 1628) LTG: Pt will propel w/c in community environ  assist needed:: (d/c - not appropriate at this time 2/2 pain) -- Note: d/c - not appropriate at this time 2/2 pain   Problem: RH Ambulation Goal: LTG Patient will ambulate in controlled environment (PT) Description: LTG: Patient will ambulate in a controlled environment, # of feet with assistance (PT). Flowsheets (Taken 04/04/2020 1628) LTG: Pt will ambulate in controlled environ  assist needed:: (modified 2/2 slow progress with distance) Supervision/Verbal cueing LTG: Ambulation distance in controlled environment: 25 ft with LRAD Goal: LTG Patient will ambulate in home environment (PT) Description: LTG: Patient will ambulate in home environment, # of feet with assistance (PT). Flowsheets (Taken 04/04/2020 1628) LTG: Pt will ambulate in home environ  assist needed:: (modified 2/2 slow progress with distance) Contact Guard/Touching assist LTG: Ambulation distance in home environment: 15 ft with LRAD Note: modified 2/2 slow progress with distance   Problem: RH Wheelchair Mobility Goal: LTG Patient will propel w/c in controlled environment (PT) Description: LTG: Patient will propel wheelchair in controlled environment, # of feet with assist (PT) Flowsheets (Taken 04/04/2020 1628) LTG: Pt will propel w/c in controlled environ  assist needed:: Supervision/Verbal cueing LTG: Propel w/c distance in controlled environment: (downgraded 2/2 slow progress) 30 ft Goal: LTG Patient will propel w/c in home environment (PT) Description: LTG: Patient will propel wheelchair in home environment, # of feet with assistance (PT). Flowsheets (Taken 04/04/2020 1628) LTG: Pt will propel  w/c in home environ  assist needed:: Supervision/Verbal cueing LTG: Propel w/c distance in home environment: (downgraded 2/2 slow progress) 25 ft

## 2020-04-05 ENCOUNTER — Inpatient Hospital Stay (HOSPITAL_COMMUNITY): Payer: BC Managed Care – PPO | Admitting: Occupational Therapy

## 2020-04-05 ENCOUNTER — Inpatient Hospital Stay (HOSPITAL_COMMUNITY): Payer: BC Managed Care – PPO

## 2020-04-05 ENCOUNTER — Inpatient Hospital Stay (HOSPITAL_COMMUNITY): Payer: BC Managed Care – PPO | Admitting: *Deleted

## 2020-04-05 LAB — GLUCOSE, CAPILLARY
Glucose-Capillary: 119 mg/dL — ABNORMAL HIGH (ref 70–99)
Glucose-Capillary: 114 mg/dL — ABNORMAL HIGH (ref 70–99)
Glucose-Capillary: 136 mg/dL — ABNORMAL HIGH (ref 70–99)
Glucose-Capillary: 136 mg/dL — ABNORMAL HIGH (ref 70–99)

## 2020-04-05 MED ORDER — PREDNISONE 20 MG PO TABS
20.0000 mg | ORAL_TABLET | Freq: Two times a day (BID) | ORAL | Status: AC
  Administered 2020-04-05 (×2): 20 mg via ORAL
  Filled 2020-04-05 (×2): qty 1
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
## 2020-04-05 NOTE — Progress Notes (Addendum)
Patient ID: Manuel Fuller, male   DOB: [birth year], 59 y.o.   MRN: [id number]  SW provided pt with HHA list. SW also informed pt on possible challenges with obtaining HH due to insurance. Pt aware efforts will be made to secure.   SW faxed DME order (w/c and RW) to [facility] ([phone]/f:[phone]). SW spoke with Barbara Cower at [facility] who will explore if there are any w/c in their warehouse or sister locations. If not, will have to order item and it will arrive after his d/c. Reports will follow-up.   Cecile Sheerer, MSW, LCSWA Office: [phone] Cell: [phone] Fax: [phone]

## 2020-04-05 NOTE — Progress Notes (Signed)
Physical Therapy Session Note  Patient Details  Name: Manuel Fuller MRN: [id number] Date of Birth: 10-12-1960  Today's Date: 04/05/2020 PT Individual Time: 1000-1100 PT Individual Time Calculation (min): 60 min   Short Term Goals: Week 1:  PT Short Term Goal 1 (Week 1): Patient to be able to perform bed mobility with MaxA of 1 and without use of bed features PT Short Term Goal 1 - Progress (Week 1): Met PT Short Term Goal 2 (Week 1): Patient to be able to perform functional transfers with no more than MinAx2 PT Short Term Goal 2 - Progress (Week 1): Met PT Short Term Goal 3 (Week 1): Patient to be able to self propel WC at least 88f with S PT Short Term Goal 3 - Progress (Week 1): Not met PT Short Term Goal 4 (Week 1): Patient to be able to gait train at least 162fwith min guard/RW PT Short Term Goal 4 - Progress (Week 1): Progressing toward goal Week 2:  PT Short Term Goal 1 (Week 2): STG = LTG 2/2 ELOS. Week 3:     Skilled Therapeutic Interventions/Progress Updates:      PAIN rates 5/10 rib/back area, rest as needed.  Pt initially supine and agreeable to treatment Supine to sit w/supervision and use of bedrails.  SPT to wc w/RW and cga/set up. Pt propels wc 504f/min assist on L due to increased pain when using LUE. Transported remainder of distance to gym  STS from wc w/cga and gait 75f63f1, 12ft105f w/RW and cga, maintains NWB well w/gait, poor clearance and heavy landing/decreased ability to offload w/UEs, rests 6min 29mween effort due to fatigue w/each trial.   Standing therex in parallel bars performed for endurance challeng/standing tolerance: L hip flexion, abd, and hamstring curls x 15 each, seated rest x 4 min due to fatigue/limited endurance Repeated above therex x 2 .  Pt transported back to room.  SPT wc to bed w/supervision w/RW.  Sit to supine w/supervision. Pt left supine w/rails up x 3, alarm set, bed in lowest position, and needs in reach.  slowly improving  w/endurance, limited primarily by endurance/pain.  Therapy Documentation Precautions:  Precautions Precautions: Fall Precaution Comments: left sided rib fractures (ribs 3-9), manubrial fracture, NWB  LLE, chronic LBP Required Braces or Orthoses: Splint/Cast Splint/Cast: LLE soft splint Restrictions Weight Bearing Restrictions: Yes LLE Weight Bearing: Non weight bearing   Therapy/Group: Individual Therapy  BarbarCallie Fielding [email or web address], 4:13 PM
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
## 2020-04-06 ENCOUNTER — Inpatient Hospital Stay (HOSPITAL_COMMUNITY): Payer: BC Managed Care – PPO | Admitting: Physical Therapy

## 2020-04-06 ENCOUNTER — Inpatient Hospital Stay (HOSPITAL_COMMUNITY): Payer: BC Managed Care – PPO | Admitting: Occupational Therapy

## 2020-04-06 ENCOUNTER — Inpatient Hospital Stay (HOSPITAL_COMMUNITY): Payer: BC Managed Care – PPO

## 2020-04-06 LAB — GLUCOSE, CAPILLARY
Glucose-Capillary: 117 mg/dL — ABNORMAL HIGH (ref 70–99)
Glucose-Capillary: 116 mg/dL — ABNORMAL HIGH (ref 70–99)
Glucose-Capillary: 116 mg/dL — ABNORMAL HIGH (ref 70–99)
Glucose-Capillary: 137 mg/dL — ABNORMAL HIGH (ref 70–99)

## 2020-04-06 MED ORDER — SORBITOL 70 % SOLN
60.0000 mL | Status: AC
  Administered 2020-04-06: 60 mL via ORAL
  Filled 2020-04-06: qty 60
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
## 2020-04-06 NOTE — Progress Notes (Signed)
Occupational Therapy Session Note  Patient Details  Name: Manuel Fuller MRN: [id number] Date of Birth: [birth year]  Today's Date: 04/06/2020 OT Group Time: 1110-1200 OT Group Time Calculation (min): 50 min  Skilled Therapeutic Interventions/Progress Updates:    Pt engaged in therapeutic w/c level dance group focusing on patient choice, UE/LE strengthening, salience, activity tolerance, and social participation. Pt was guided through various dance-based exercises involving UEs/LEs and trunk. All music was selected by group members. Emphasis placed on general strengthening and activity tolerance. Pts affect was visibly brightened throughout session. He was socially participative with other group members, requesting music, and taking rest breaks as needed to accommodate his rib pain. Pt also modified exercises with OT guidance to minimize pain as well. At end of session pt was returned to room and remained sitting up in w/c with all needs.   Pt arrived later to group today due to nursing care.   Therapy Documentation Precautions:  Precautions Precautions: Fall Precaution Comments: left sided rib fractures (ribs 3-9), manubrial fracture, NWB  LLE, chronic LBP Required Braces or Orthoses: Splint/Cast Splint/Cast: LLE soft splint Restrictions Weight Bearing Restrictions: Yes LLE Weight Bearing: Non weight bearing ADL: ADL Eating: Set up Grooming: Setup Upper Body Bathing: Maximal assistance Lower Body Bathing: Maximal assistance Upper Body Dressing: Maximal assistance Lower Body Dressing: Maximal assistance Toileting: Maximal assistance Toilet Transfer: Maximal assistance Tub/Shower Transfer: Unable to assess :     Therapy/Group: Group Therapy  Masaki Rothbauer A Laruth Hanger 04/06/2020, 12:52 PM
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
## 2020-04-07 ENCOUNTER — Inpatient Hospital Stay (HOSPITAL_COMMUNITY): Payer: BC Managed Care – PPO | Admitting: Occupational Therapy

## 2020-04-07 ENCOUNTER — Inpatient Hospital Stay (HOSPITAL_COMMUNITY): Payer: BC Managed Care – PPO | Admitting: Physical Therapy

## 2020-04-07 DIAGNOSIS — I10 Essential (primary) hypertension: Secondary | ICD-10-CM

## 2020-04-07 DIAGNOSIS — G8918 Other acute postprocedural pain: Secondary | ICD-10-CM

## 2020-04-07 DIAGNOSIS — K5903 Drug induced constipation: Secondary | ICD-10-CM

## 2020-04-07 DIAGNOSIS — E1165 Type 2 diabetes mellitus with hyperglycemia: Secondary | ICD-10-CM

## 2020-04-07 LAB — GLUCOSE, CAPILLARY
Glucose-Capillary: 118 mg/dL — ABNORMAL HIGH (ref 70–99)
Glucose-Capillary: 129 mg/dL — ABNORMAL HIGH (ref 70–99)
Glucose-Capillary: 124 mg/dL — ABNORMAL HIGH (ref 70–99)
Glucose-Capillary: 139 mg/dL — ABNORMAL HIGH (ref 70–99)

## 2020-04-07 MED ORDER — DICLOFENAC SODIUM 1 % EX GEL
2.0000 g | Freq: Four times a day (QID) | CUTANEOUS | Status: DC
  Administered 2020-04-07: 2 g via TOPICAL
  Filled 2020-04-07: qty 100
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
## 2020-04-07 NOTE — Brief Op Note (Signed)
03/27/2020  6:06 PM  PATIENT:  Manuel Fuller  59 y.o. male  [phone]
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
## 2020-04-08 LAB — GLUCOSE, CAPILLARY
Glucose-Capillary: 112 mg/dL — ABNORMAL HIGH (ref 70–99)
Glucose-Capillary: 88 mg/dL (ref 70–99)
Glucose-Capillary: 173 mg/dL — ABNORMAL HIGH (ref 70–99)
Glucose-Capillary: 111 mg/dL — ABNORMAL HIGH (ref 70–99)
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
## 2020-04-09 ENCOUNTER — Inpatient Hospital Stay (HOSPITAL_COMMUNITY): Payer: BC Managed Care – PPO | Admitting: Physical Therapy

## 2020-04-09 ENCOUNTER — Inpatient Hospital Stay (HOSPITAL_COMMUNITY): Payer: BC Managed Care – PPO | Admitting: Occupational Therapy

## 2020-04-09 LAB — BASIC METABOLIC PANEL
Anion gap: 11 (ref 5–15)
BUN: 23 mg/dL — ABNORMAL HIGH (ref 6–20)
CO2: 24 mmol/L (ref 22–32)
Calcium: 9.2 mg/dL (ref 8.9–10.3)
Chloride: 101 mmol/L (ref 98–111)
Creatinine, Ser: 1.01 mg/dL (ref 0.61–1.24)
GFR calc Af Amer: >60 mL/min (ref >60)
GFR calc non Af Amer: >60 mL/min (ref >60)
Glucose, Bld: 155 mg/dL — ABNORMAL HIGH (ref 70–99)
Potassium: 3.8 mmol/L (ref 3.5–5.1)
Sodium: 136 mmol/L (ref 135–145)

## 2020-04-09 LAB — GLUCOSE, CAPILLARY
Glucose-Capillary: 123 mg/dL — ABNORMAL HIGH (ref 70–99)
Glucose-Capillary: 153 mg/dL — ABNORMAL HIGH (ref 70–99)
Glucose-Capillary: 109 mg/dL — ABNORMAL HIGH (ref 70–99)
Glucose-Capillary: 130 mg/dL — ABNORMAL HIGH (ref 70–99)

## 2020-04-09 MED ORDER — CAMPHOR-MENTHOL 0.5-0.5 % EX LOTN
TOPICAL_LOTION | CUTANEOUS | Status: DC | PRN
  Filled 2020-04-09: qty 222
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
## 2020-04-09 NOTE — Progress Notes (Signed)
Physical Therapy Session Note  Patient Details  Name: Manuel Fuller MRN: [id number] Date of Birth: May 10, 1961  Today's Date: 04/09/2020 PT Individual Time: 9147-8295 PT Individual Time Calculation (min): 42 min   Short Term Goals: Week 2:  PT Short Term Goal 1 (Week 2): STG = LTG 2/2 ELOS.  Skilled Therapeutic Interventions/Progress Updates:  Pt received in bed & agreeable to tx. Pt reports L shoulder soreness following dance group on Friday. Pt also reports he wishes he had OT prior to PT session, as he was hoping a warm shower would help with L shoulder/back/rib pain. Pt reports he is premedicated & reports 4/10 pain during session with rest breaks provided. Supine>sit with hospital bed features & mod I. Sit<>stand and stand pivot to w/c with RW & supervision. Discussed sitting in various seats at home (low couch, dining chair without armrests) & problem solving transferring sit<>stand from those surfaces & after conversation pt reports he will only sit in recliner or w/c at home. Gait x 20 ft + 20 ft with RW & close supervision with w/c follow with pt demonstrating improved activity tolerance. W/c propulsion back to room with max distance of 40 ft with BUE. Pt demonstrates decreased ability to reach back & propel w/c 2/2 posterior rib pain. At end of session pt left in w/c with call bell & all needs in reach.  Therapy Documentation Precautions:  Precautions Precautions: Fall Precaution Comments: left sided rib fractures (ribs 3-9), manubrial fracture, NWB  LLE, chronic LBP Required Braces or Orthoses: Splint/Cast Splint/Cast: LLE soft splint Restrictions Weight Bearing Restrictions: Yes LLE Weight Bearing: Non weight bearing    Therapy/Group: Individual Therapy  Sandi Mariscal 04/09/2020, 11:04 AM

## 2020-04-09 NOTE — Progress Notes (Signed)
Occupational Therapy Session Note  Patient Details  Name: Manuel Fuller MRN: [id number] Date of Birth: 12-15-1960   Today's Date: 04/09/2020  Session 1 OT Individual Time: 1100-1158 OT Individual Time Calculation (min): 58 min   Session 2 OT Individual Time: 4132-4401 OT Individual Time Calculation (min): 73 min   Short Term Goals: Week 2:  OT Short Term Goal 1 (Week 2): STGs=LTGs secondary to upcoming discharge  Skilled Therapeutic Interventions/Progress Updates:  Session 1   Pt greeted seated in wc and agreeable to OT treatment session. Pt requesting to shower. OT donned waterproof bag over LLE prior to shower. Pt then ambulated into bathroom with RW using hopping method and CGA. Pt needed encouragement to try to remove pants in standing. Educated on alternating UE support for balance. Pt was able to pull down pants with encouragement and CGA for balance.  Bathing completed shower level with use of LH sponge to was LEs and leaning method to wash buttocks. Pt completed stand-pivot out of shower with CGA. Pt was able to bring R LE into figure 4 position to thread sock. Dressing completed from wc level with set-up A for UB and min A for LB dressing using reacher to thread pant legs, then CGA for standing balance while alternating UEs to pull up pants. Stand-pivot back to bed with RW and CGA. Pt able to lift BLEs back into bed with supervision today. Pt left semi-reclined in bed with nurse tech present to get blood glucose.   Session 2 Pt greeted semi-reclined in bed and agreeable to OT treatment session. Pt completed bed mobility with HOB elevated and min A. Pt then completed sit<>stand and stand-pivot transfer from bed to wc with RW and CGA. Pt brought to therapy gym in wc. Worked on standing balance/endurance with alternating UEs to reach behind and collect clothes pins attached to makeshift waste band using theraband. Pt needed assistance to get clothespin in the very back of pants 2/2 limited  L shoulder internal rotation from previous L TSA. Clothes pins then placed in the front on waist theraband, then worked on reaching forward to place clothes pins on basketball hoop. OT issued pt reacher bag and worked on standing use of reacher to collect wash cloths and place in clothes basket. Worked on wc propulsion back towards room with focus on longer arm strides. Pt then ambulated 10 feet from doorway back to bed wit RW and CGA. Pt returned to bed with supervision and left semi-reclined with bed alarm on, call bell in reach, and needs met.   Therapy Documentation Precautions:  Precautions Precautions: Fall Precaution Comments: left sided rib fractures (ribs 3-9), manubrial fracture, NWB  LLE, chronic LBP Required Braces or Orthoses: Splint/Cast Splint/Cast: LLE soft splint Restrictions Weight Bearing Restrictions: Yes LLE Weight Bearing: Non weight bearing Pain: Pain Assessment Pain Scale: 0-10 Pain Score: 6  Pain Type: Acute pain Pain Location: Back Pain Descriptors / Indicators: Aching Pain Frequency: Intermittent Pain Onset: On-going Patients Stated Pain Goal: 0 Pain Intervention(s): Repositioned   Therapy/Group: Individual Therapy  Valma Cava 04/09/2020, 3:10 PM

## 2020-04-10 ENCOUNTER — Inpatient Hospital Stay (HOSPITAL_COMMUNITY): Payer: BC Managed Care – PPO | Admitting: Physical Therapy

## 2020-04-10 ENCOUNTER — Inpatient Hospital Stay (HOSPITAL_COMMUNITY): Payer: BC Managed Care – PPO | Admitting: Occupational Therapy

## 2020-04-10 ENCOUNTER — Encounter (HOSPITAL_COMMUNITY): Payer: BC Managed Care – PPO | Admitting: Psychology

## 2020-04-10 LAB — GLUCOSE, CAPILLARY
Glucose-Capillary: 106 mg/dL — ABNORMAL HIGH (ref 70–99)
Glucose-Capillary: 125 mg/dL — ABNORMAL HIGH (ref 70–99)
Glucose-Capillary: 111 mg/dL — ABNORMAL HIGH (ref 70–99)
Glucose-Capillary: 129 mg/dL — ABNORMAL HIGH (ref 70–99)

## 2020-04-10 MED ORDER — ENOXAPARIN (LOVENOX) PATIENT EDUCATION KIT
PACK | Freq: Once | Status: AC
  Filled 2020-04-10: qty 1

## 2020-04-10 NOTE — Progress Notes (Signed)
Patient ID: Manuel Fuller, male   DOB: [birth year], 59 y.o.   MRN: [id number]   04/09/2020- SW delivered DME: RW and w/c to patient room. Preferred [facility] is [facility].   Cecile Sheerer, MSW, LCSWA Office: [phone] Cell: [phone] Fax: [phone]

## 2020-04-10 NOTE — Progress Notes (Signed)
Occupational Therapy Session Note  Patient Details  Name: Manuel Fuller MRN: [id number] Date of Birth: 1961-01-25  Today's Date: 04/10/2020 OT Individual Time: 0930-1030 OT Individual Time Calculation (min): 60 min    Short Term Goals: Week 2:  OT Short Term Goal 1 (Week 2): STGs=LTGs secondary to upcoming discharge  Skilled Therapeutic Interventions/Progress Updates:    Pt greeted seated in wc rolling wc in hallway and agreeable to OT treatment session. Pt propelled wc back to room with increased time and supervision. Attempted sit<>stand using standard walker that was delivered to room. Pt unable to safely stand due to standard walker being too narrow for patients hips. Social work informed of pt's need for a bariatric RW in order for pt to use walker safely. Pt propelled wc to therapy gym with OT assist to maneuver wc through tight doorway. UB there-ex using [employer] arm bike. PT completed 5 mins x2 with extended rest break in between. OT gave pt home exercise handout for UB there-ex. Went through exercises with pt using level 2 orange and level 3 green theraband. Verbal and tactile cues for positioning and body mechanics.  Therapy Documentation Precautions:  Precautions Precautions: Fall Precaution Comments: left sided rib fractures (ribs 3-9), manubrial fracture, NWB  LLE, chronic LBP Required Braces or Orthoses: Splint/Cast Splint/Cast: LLE soft splint Restrictions Weight Bearing Restrictions: Yes LLE Weight Bearing: Non weight bearing Pain: Pain Assessment Pain Scale: 0-10 Pain Score: 3  Pain Type: Acute pain Pain Location: Back Pain Orientation: Left;Upper Pain Descriptors / Indicators: Aching Pain Frequency: Intermittent Pain Onset: On-going Patients Stated Pain Goal: 3 Pain Intervention(s): Repositioned   Therapy/Group: Individual Therapy  Mal Amabile 04/10/2020, 9:54 AM
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
## 2020-04-10 NOTE — Progress Notes (Signed)
Occupational Therapy Session Note  Patient Details  Name: Manuel Fuller MRN: [id number] Date of Birth: June 16, 1961  Today's Date: 04/10/2020 OT Individual Time: 1300-1400 OT Individual Time Calculation (min): 60 min    Short Term Goals: Week 2:  OT Short Term Goal 1 (Week 2): STGs=LTGs secondary to upcoming discharge    Skilled Therapeutic Interventions/Progress Updates:    Upon entering the room, pt supine in bed with no c/o pain and requesting to shower this session. Bed mobility performed at supervision. Pt standing from bed with supervision and ambulating 10' with RW into bathroom with CGA. Pt able to doff shorts with supervision and encouragement. Pt transferring onto TTB with CGA for safety. OT assisting pt with bagging up L LE to keep it dry. Pt bathing while seated on TTB with min A overall with assist to wash buttocks thoroughly. Pt transferred into wheelchair in same manner and utilized [facility] reacher to thread pants onto B feet. Pt standing and requests assistance from therapist to pull pants over B hips secondary to fatigue. Pt seated on EOB and sit >supine with supervision. Call bell and all needed items within reach upon exiting the room.   Therapy Documentation Precautions:  Precautions Precautions: Fall Precaution Comments: left sided rib fractures (ribs 3-9), manubrial fracture, NWB  LLE, chronic LBP Required Braces or Orthoses: Splint/Cast Splint/Cast: LLE soft splint Restrictions Weight Bearing Restrictions: Yes LLE Weight Bearing: Non weight bearing Pain: Pain Assessment Pain Scale: 0-10 Pain Score: 3  Pain Type: Acute pain Pain Location: Back Pain Orientation: Upper;Left Pain Descriptors / Indicators: Aching Pain Frequency: Intermittent Pain Onset: With Activity Patients Stated Pain Goal: 3 Pain Intervention(s): Medication (See eMAR) (oxycodone given prior to therapy) ADL: ADL Eating: Independent Grooming: Independent Upper Body Bathing:  Supervision/safety Lower Body Bathing: Supervision/safety Upper Body Dressing: Supervision/safety Lower Body Dressing: Supervision/safety Toileting: Supervision/safety Toilet Transfer: Close supervision Tub/Shower Transfer: Unable to assess   Therapy/Group: Individual Therapy  Alen Bleacher 04/10/2020, 2:03 PM

## 2020-04-10 NOTE — Progress Notes (Signed)
Physical Therapy Session Note  Patient Details  Name: BENTLY WYSS MRN: [id number] Date of Birth: Jun 29, 1961  Today's Date: 04/10/2020 PT Individual Time: 0805-0900 PT Individual Time Calculation (min): 55 min   Short Term Goals: Week 2:  PT Short Term Goal 1 (Week 2): STG = LTG 2/2 ELOS.  Skilled Therapeutic Interventions/Progress Updates:  Pt received in bed & agreeable to tx. Assisted pt with threading pants on BLE for time management but pt able to pull pants over hips from bed level with cuing to not push through LLE. Supine>sit with mod I with hospital bed features & extra time 2/2 increased pain. Therapist donned tennis shoe total assist. Sit<>stand and stand pivot to w/c with RW & close supervision. Pt delivered standard walker & PT notified LCSW of need for double wheeled RW. PT adjusted ELR length for optimal positioning. Pt requesting to go outside for w/c mobility in community setting. Transported pt outside for time management & pt propelled outside [employer] with BUE & supervision with pt reporting his newly delivered w/c is easier to propel than our w/c. Pt is able to back onto elevator with instructional cuing & propel down ramp with instructional cuing for increased safety. At end of session pt left in w/c with call bell in reach.  Therapy Documentation Precautions:  Precautions Precautions: Fall Precaution Comments: left sided rib fractures (ribs 3-9), manubrial fracture, NWB  LLE, chronic LBP Required Braces or Orthoses: Splint/Cast Splint/Cast: LLE soft splint Restrictions Weight Bearing Restrictions: Yes LLE Weight Bearing: Non weight bearing  Pain: 3/10 at rest, 4/10 with movement - rest breaks provided PRN & pt is premedicated    Therapy/Group: Individual Therapy  Sandi Mariscal 04/10/2020, 9:11 AM
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
## 2020-04-10 NOTE — Progress Notes (Addendum)
Occupational Therapy Discharge Summary  Patient Details  Name: Manuel Fuller MRN: [id number] Date of Birth: 07/13/1961   Patient has met 9 of 9 long term goals due to improved activity tolerance, improved balance and ability to compensate for deficits.  Patient to discharge at overall Supervision- MIN A level.  Patient's care partner is independent to provide the necessary physical assistance at discharge for higher level iADL tasks. Pt has made good progress with mobility but continues to requires physical A for clothing management during LB dressing/toileting. Pt abl eot direct care and states mother will be able to assist at home  Reasons goals not met: n/a  Recommendation:  Patient will benefit from ongoing skilled OT services in home health setting to continue to advance functional skills in the area of BADL and Reduce care partner burden.  Equipment: bariatric RW, wc with elevating leg rests  Reasons for discharge: treatment goals met and discharge from hospital  Patient/family agrees with progress made and goals achieved: Yes  OT Discharge Precautions/Restrictions  Precautions Precautions: Fall Precaution Comments: left sided rib fractures (ribs 3-9), manubrial fracture, NWB  LLE, chronic LBP Required Braces or Orthoses: Splint/Cast Splint/Cast: LLE soft splint General   Pain Pain Assessment Pain Scale: 0-10 Pain Score: 3  Pain Type: Acute pain Pain Location: Back Pain Orientation: Upper;Left Pain Descriptors / Indicators: Aching Pain Frequency: Intermittent Pain Onset: With Activity Patients Stated Pain Goal: 3 Pain Intervention(s): Medication (See eMAR) (oxycodone given prior to therapy) ADL ADL Eating: Independent Grooming: Independent Upper Body Bathing: Supervision/safety Lower Body Bathing: Supervision/safety Upper Body Dressing: Supervision/safety Lower Body Dressing: Supervision/safety Toileting: Supervision/safety Toilet Transfer: Close  supervision Tub/Shower Transfer: Unable to assess Perception  Perception: Within Functional Limits Praxis Praxis: Intact Cognition Overall Cognitive Status: Within Functional Limits for tasks assessed Sensation Sensation Light Touch: Appears Intact Proprioception: Appears Intact Coordination Gross Motor Movements are Fluid and Coordinated: No Fine Motor Movements are Fluid and Coordinated: Yes Motor  Motor Motor - Discharge Observations: generalized weakness & deconditioning Mobility  Bed Mobility Supine to Sit: Independent with assistive device Sit to Supine: Independent with assistive device Transfers Sit to Stand: Supervision/Verbal cueing Stand to Sit: Supervision/Verbal cueing  Balance Static Sitting Balance Static Sitting - Level of Assistance: 6: Modified independent (Device/Increase time) Dynamic Sitting Balance Dynamic Sitting - Level of Assistance: 6: Modified independent (Device/Increase time) Static Standing Balance Static Standing - Level of Assistance: 5: Stand by assistance Dynamic Standing Balance Dynamic Standing - Level of Assistance: 5: Stand by assistance Extremity/Trunk Assessment RUE Assessment RUE Assessment: Within Functional Limits LUE Assessment LUE Assessment: Exceptions to WFL Active Range of Motion (AROM) Comments: L shoulder FF limited to ~130 degrees 2/2 pain General Strength Comments: limited by rib pain   Elisabeth S Doe 04/10/2020, 2:47 PM  

## 2020-04-10 NOTE — Progress Notes (Signed)
Physical Therapy Discharge Summary  Patient Details  Name: Manuel Fuller MRN: [id number] Date of Birth: 06-Mar-1961  Today's Date: 04/11/2020   Patient has met 10 of 10 long term goals due to improved activity tolerance, improved balance, improved postural control, increased strength, decreased pain and ability to compensate for deficits.  Patient to discharge at a w/c level with set up assist, household ambulation with RW & supervision. Pt's friend present during session prior to d/c & therapist reviewed recommendations with him, otherwise no hands on training completed.  Reasons goals not met: n/a  Recommendation:  Patient will benefit from ongoing skilled PT services in home health setting to continue to advance safe functional mobility, address ongoing impairments in gait, endurance, strength, w/c mobility, and minimize fall risk.  Equipment: w/c, RW  Reasons for discharge: treatment goals met and discharge from hospital  Patient/family agrees with progress made and goals achieved: Yes  PT Discharge Precautions/Restrictions Precautions Precautions: Fall Precaution Comments: left sided rib fractures (ribs 3-9), manubrial fracture, NWB  LLE, chronic LBP Required Braces or Orthoses: Splint/Cast Splint/Cast: LLE soft splint Restrictions Weight Bearing Restrictions: Yes LLE Weight Bearing: Non weight bearing  Vision/Perception  No baseline visual deficits. No changes in baseline vision. Perception WNL.  Cognition Overall Cognitive Status: Within Functional Limits for tasks assessed Arousal/Alertness: Awake/alert Memory: Appears intact Awareness: Appears intact Problem Solving: Appears intact  Sensation Sensation Light Touch: Appears Intact Proprioception: Appears Intact Coordination Gross Motor Movements are Fluid and Coordinated: No Fine Motor Movements are Fluid and Coordinated: Yes Coordination and Movement Description: limited by weakness, pain, and LLE immoblity in  cast   Motor  Motor Motor - Discharge Observations: generalized weakness & deconditioning   Mobility Bed Mobility Bed Mobility: Rolling Right;Rolling Left;Supine to Sit;Sit to Supine (HOB elevated, bed rails) Rolling Right: Independent with assistive device Rolling Left: Independent with assistive device Supine to Sit: Independent with assistive device Sit to Supine: Independent with assistive device Transfers Transfers: Sit to Stand;Stand to Sit;Stand Pivot Transfers Sit to Stand: Supervision/Verbal cueing Stand to Sit: Supervision/Verbal cueing Stand Pivot Transfers: Supervision/Verbal cueing Transfer (Assistive device): Rolling walker  Locomotion  Gait Ambulation: Yes Gait Assistance: Supervision/Verbal cueing Gait Distance (Feet): 25 Feet Assistive device: Rolling walker Gait Assistance Details: Verbal cues for technique;Verbal cues for precautions/safety Gait Gait: Yes Gait Pattern: Impaired Gait Pattern:  (decreased step length RLE, step to RLE, decreased foot clearance RLE) Gait velocity: decreased Stairs / Additional Locomotion Stairs: No Wheelchair Mobility Wheelchair Mobility: Yes Wheelchair Assistance: Set up Lexicographer: Both upper extremities Wheelchair Parts Management: Needs assistance Distance: 150 ft   Trunk/Postural Assessment  Postural Control Righting Reactions: decreased 2/2 NWB LLE Protective Responses: decreased 2/2 NWB LLE   Balance Balance Balance Assessed: Yes Static Sitting Balance Static Sitting - Level of Assistance: 6: Modified independent (Device/Increase time) Dynamic Sitting Balance Dynamic Sitting - Level of Assistance: 6: Modified independent (Device/Increase time) Static Standing Balance Static Standing - Level of Assistance: 5: Stand by assistance (BUE support on RW) Dynamic Standing Balance Dynamic Standing - Level of Assistance: 5: Stand by assistance (during stand pivots & gait with BUE support on  RW)  Extremity Assessment  RUE Assessment General Strength Comments: limited by rib pain LUE Assessment General Strength Comments: limited by rib pain RLE Assessment RLE Assessment: Within Functional Limits LLE Assessment General Strength Comments: not formally tested 2/2 NWB status    Waunita Schooner 04/11/2020, 3:02 PM

## 2020-04-10 NOTE — Progress Notes (Signed)
Discussed home Lovenox administration with pt. Pt already received today's dose prior to discussion. Pt states he previously had given himself Lovenox injections and would be comfortable doing so again at home. Pt would benefit from a return demonstration with his next dose tomorrow.   Marylu Lund, RN

## 2020-04-11 ENCOUNTER — Inpatient Hospital Stay (HOSPITAL_COMMUNITY): Payer: BC Managed Care – PPO

## 2020-04-11 ENCOUNTER — Inpatient Hospital Stay (HOSPITAL_COMMUNITY): Payer: BC Managed Care – PPO | Admitting: Physical Therapy

## 2020-04-11 LAB — GLUCOSE, CAPILLARY
Glucose-Capillary: 120 mg/dL — ABNORMAL HIGH (ref 70–99)
Glucose-Capillary: 109 mg/dL — ABNORMAL HIGH (ref 70–99)
Glucose-Capillary: 171 mg/dL — ABNORMAL HIGH (ref 70–99)
Glucose-Capillary: 103 mg/dL — ABNORMAL HIGH (ref 70–99)

## 2020-04-11 MED ORDER — ENOXAPARIN SODIUM 40 MG/0.4ML ~~LOC~~ SOLN: SUBCUTANEOUS | 1 refills | Status: DC

## 2020-04-11 MED ORDER — ATORVASTATIN CALCIUM 20 MG PO TABS: 20.0000 mg | ORAL_TABLET | Freq: Every day | ORAL | 0 refills | Status: AC

## 2020-04-11 MED ORDER — LISINOPRIL-HYDROCHLOROTHIAZIDE 20-25 MG PO TABS: 1.0000 | ORAL_TABLET | Freq: Every morning | ORAL | 0 refills | Status: AC

## 2020-04-11 MED ORDER — POLYETHYLENE GLYCOL 3350 17 G PO PACK: 17.0000 g | PACK | Freq: Every day | ORAL | 0 refills | Status: DC

## 2020-04-11 MED ORDER — METHOCARBAMOL 500 MG PO TABS: 1000.0000 mg | ORAL_TABLET | Freq: Four times a day (QID) | ORAL | 0 refills | Status: DC

## 2020-04-11 MED ORDER — OXYCODONE HCL 10 MG PO TABS: 10.0000 mg | ORAL_TABLET | ORAL | 0 refills | Status: DC | PRN

## 2020-04-11 MED ORDER — METFORMIN HCL 500 MG PO TABS: 500.0000 mg | ORAL_TABLET | Freq: Two times a day (BID) | ORAL | 0 refills | Status: AC

## 2020-04-11 MED ORDER — HYDROCHLOROTHIAZIDE 25 MG PO TABS: 25.0000 mg | ORAL_TABLET | Freq: Every day | ORAL | 0 refills | Status: DC

## 2020-04-11 MED ORDER — ACETAMINOPHEN 325 MG PO TABS: 325.0000 mg | ORAL_TABLET | ORAL | Status: AC | PRN

## 2020-04-11 MED ORDER — GABAPENTIN 100 MG PO CAPS: 200.0000 mg | ORAL_CAPSULE | Freq: Three times a day (TID) | ORAL | 0 refills | Status: DC

## 2020-04-11 MED ORDER — SENNOSIDES-DOCUSATE SODIUM 8.6-50 MG PO TABS: 2.0000 | ORAL_TABLET | Freq: Every day | ORAL | Status: DC

## 2020-04-11 NOTE — Progress Notes (Signed)
Physical Therapy Session Note  Patient Details  Name: Manuel Fuller MRN: [id number] Date of Birth: 24-Jun-1961  Today's Date: 04/11/2020 PT Individual Time: 0807-0900 and 1404-1500 PT Individual Time Calculation (min): 53 min and 56 min  Short Term Goals: Week 2:  PT Short Term Goal 1 (Week 2): STG = LTG 2/2 ELOS.  Skilled Therapeutic Interventions/Progress Updates:  Treatment 1: Pt received in bed & agreeable to tx. Discussed RW with pt with PT educating pt he can purchase a wide RW if he wishes, but insurance will only provided standard width RW (per Child psychotherapist). Pt dons paper scrub pants from bed level with reacher, with pt requiring PT to pull pants over hips once standing EOB. Supine>sit with mod I with hospital bed features. Sit<>stand with RW & short distance gait to w/c with RW & close supervision. Pt propels w/c room>ortho gym & in hallway with BUE & supervision with therapist educating pt on proper positioning in w/c vs pt electing to sacral sit. Ambulatory car transfer at sedan simulated height with RW & supervision. Pt voices no questions/concerns re: d/c home tomorrow. Reviewed need for pt to be propelled to doorway in w/c then ambulate in/out of house 2/2 narrow doorway & recommendations to sit to use BSC vs standing 2/2 impaired balance with pt reporting understanding of all info. Pt left in w/c with all needs in reach. Pain: 7-8/10 posterior ribs - rest breaks provided PRN, nurse administered meds during session   Treatment 2: Pt received in room on BSC. Pt with continent BM & reports inability to perform peri hygiene 2/2 rib/back pain & he believes his mother will assist him with task at home. Sit>stand from [facility], w/c with RW & supervision during session. Therapist provides total assist for peri hygiene. Pt completes stand pivot BSC>bed, and short distance gait bed>w/c with RW & close supervision. Vitals checked by NT & pt's BP low (90/62 mmHg) so therapist donned ted hose for BP  management. Pt propels w/c room>outside apartment and dayroom>gym with BUE & supervision. Gait in hallway & into apartment over threshold to practice transitions with RW & close supervision with pt very fatigued at end. At end of session pt assisted back to bed & left with all needs in reach. Pain: 8/10 posterior ribs - rest breaks provided PRN & pt premedicated  Sensation tested, please see d/c summary.   Therapy Documentation Precautions:  Precautions Precautions: Fall Precaution Comments: left sided rib fractures (ribs 3-9), manubrial fracture, NWB  LLE, chronic LBP Required Braces or Orthoses: Splint/Cast Splint/Cast: LLE soft splint Restrictions Weight Bearing Restrictions: Yes LLE Weight Bearing: Non weight bearing    Therapy/Group: Individual Therapy  Sandi Mariscal 04/11/2020, 3:02 PM
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
## 2020-04-11 NOTE — Discharge Instructions (Signed)
Inpatient Rehab Discharge Instructions  [name] Discharge date and time: No discharge date for patient encounter.   Activities/Precautions/ Functional Status: Activity: Nonweightbearing left lower extremity Diet: diabetic diet Wound Care: keep wound clean and dry Functional status:  ___ No restrictions     ___ Walk up steps independently ___ 24/7 supervision/assistance   ___ Walk up steps with assistance ___ Intermittent supervision/assistance  ___ Bathe/dress independently ___ Walk with walker     _x__ Bathe/dress with assistance ___ Walk Independently    ___ Shower independently ___ Walk with assistance    ___ Shower with assistance ___ No alcohol     ___ Return to work/school ________  COMMUNITY REFERRALS UPON DISCHARGE:    Home Health:   PT                  OT                 Agency: [facility]: [phone] *Please expect follow-up within 2-3 days from discharge to schedule your home visit. If you have not received follow-up be sure to contact the branch directly.*  Medical Equipment/Items Ordered: wheelchair and rolling walker                                                 Agency/Supplier: [facility] [phone]   Special Instructions: No driving smoking or alcohol   My questions have been answered and I understand these instructions. I will adhere to these goals and the provided educational materials after my discharge from the hospital.  Patient/Caregiver Signature _______________________________ Date __________  Clinician Signature _______________________________________ Date __________  Please bring this form and your medication list with you to all your follow-up doctor's appointments.
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
## 2020-04-12 LAB — BASIC METABOLIC PANEL
Anion gap: 12 (ref 5–15)
BUN: 25 mg/dL — ABNORMAL HIGH (ref 6–20)
CO2: 23 mmol/L (ref 22–32)
Calcium: 8.9 mg/dL (ref 8.9–10.3)
Chloride: 99 mmol/L (ref 98–111)
Creatinine, Ser: 0.95 mg/dL (ref 0.61–1.24)
GFR calc Af Amer: >60 mL/min (ref >60)
GFR calc non Af Amer: >60 mL/min (ref >60)
Glucose, Bld: 110 mg/dL — ABNORMAL HIGH (ref 70–99)
Potassium: 4 mmol/L (ref 3.5–5.1)
Sodium: 134 mmol/L — ABNORMAL LOW (ref 135–145)

## 2020-04-12 LAB — GLUCOSE, CAPILLARY
Glucose-Capillary: 107 mg/dL — ABNORMAL HIGH (ref 70–99)
Glucose-Capillary: 100 mg/dL — ABNORMAL HIGH (ref 70–99)
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
## 2020-04-12 NOTE — Progress Notes (Signed)
Patient discharged home with friend. Discharge instructions given by Jesusita Oka, PA. No further questions from patient. Belongs sent with patient. No distress noted and patient was stable.

## 2020-04-12 NOTE — Progress Notes (Signed)
Inpatient Rehabilitation Care Coordinator  Discharge Note  The overall goal for the admission was met for:   Discharge location: Yes. D/c to his mother's home who will provide 24/7 supervision. Will have assistance from friends as well.   Length of Stay: Yes. 13 days.  Discharge activity level: Yes. Supervision-Min A.  Home/community participation: Yes. Limited.  Services provided included: MD, RD, PT, OT, RN, CM, TR, Pharmacy, Neuropsych and SW  Financial Services: Private Insurance: [place]  Follow-up services arranged: Home Health: [facility] for HHPT/OT and DME: [facility] for w/c and RW  Comments (or additional information): contact pt [phone]  Patient/Family verbalized understanding of follow-up arrangements: Yes  Individual responsible for coordination of the follow-up plan: Pt to have assistance with coordinating care needs.   Confirmed correct DME delivered: Rana Snare 04/12/2020    Rana Snare

## 2020-04-12 NOTE — Plan of Care (Signed)
  Problem: Consults Goal: RH GENERAL PATIENT EDUCATION Description: See Patient Education module for education specifics. Outcome: Completed/Met Goal: Skin Care Protocol Initiated - if Braden Score 18 or less Description: If consults are not indicated, leave blank or document N/A Outcome: Completed/Met Goal: Diabetes Guidelines if Diabetic/Glucose > 140 Description: If diabetic or lab glucose is > 140 mg/dl - Initiate Diabetes/Hyperglycemia Guidelines & Document Interventions  Outcome: Completed/Met   Problem: RH BOWEL ELIMINATION Goal: RH STG MANAGE BOWEL WITH ASSISTANCE Description: STG Manage Bowel with Mod I Assistance. Outcome: Completed/Met Goal: RH STG MANAGE BOWEL W/MEDICATION W/ASSISTANCE Description: STG Manage Bowel with Medication with Mod I Assistance. Outcome: Completed/Met   Problem: RH SAFETY Goal: RH STG ADHERE TO SAFETY PRECAUTIONS W/ASSISTANCE/DEVICE Description: STG Adhere to Safety Precautions With Mod I Assistance and appropriate assistive Device while following weightbearing precautions. Outcome: Completed/Met   Problem: RH PAIN MANAGEMENT Goal: RH STG PAIN MANAGED AT OR BELOW PT'S PAIN GOAL Description: <4 on a 0-10 pain scale. Outcome: Completed/Met   Problem: RH KNOWLEDGE DEFICIT GENERAL Goal: RH STG INCREASE KNOWLEDGE OF SELF CARE AFTER HOSPITALIZATION Description: Patient will be able to demonstrate knowledge of medication management, weightbearing precautions, skin and wound care management, and follow up care with the MD post discharge with Mod I assist from CIR staff. Outcome: Completed/Met

## 2020-04-16 ENCOUNTER — Telehealth: Payer: Self-pay | Admitting: Registered Nurse

## 2020-04-16 NOTE — Telephone Encounter (Signed)
Placed a call to Manuel Fuller no answer, left message to return the call.

## 2020-04-20 NOTE — Telephone Encounter (Signed)
Transitional Care call  Patient name: Manuel Fuller  DOB: 04/08/1961 1. Are you/is patient experiencing any problems since coming home? No  a. Are there any questions regarding any aspect of care? No 2. Are there any questions regarding medications administration/dosing? No a. Are meds being taken as prescribed? Yes b. "Patient should review meds with caller to confirm" Medication List Reviewed 3. Have there been any falls? No 4. Has Home Health been to the house and/or have they contacted you? Yes, [facility] a. If not, have you tried to contact them? No b. Can we help you contact them? No 5. Are bowels and bladder emptying properly? Yes a. Are there any unexpected incontinence issues? No b. If applicable, is patient following bowel/bladder programs? NA 6. Any fevers, problems with breathing, unexpected pain? No 7. Are there any skin problems or new areas of breakdown? No 8. Has the patient/family member arranged specialty MD follow up (ie cardiology/neurology/renal/surgical/etc.)?  Yes a. Can we help arrange? NA 9. Does the patient need any other services or support that we can help arrange? No 10. Are caregivers following through as expected in assisting the patient? Yes 11. Has the patient quit smoking, drinking alcohol, or using drugs as recommended? (                        )  Appointment date/time August 20th at 3:00 for Telephone Call with Dr. Carlis Abbott, due to Transportation issues. Office address [address] [employer]

## 2020-04-25 ENCOUNTER — Encounter: Payer: BC Managed Care – PPO | Admitting: Registered Nurse

## 2020-05-18 ENCOUNTER — Encounter: Payer: Self-pay | Admitting: Physical Medicine and Rehabilitation

## 2020-05-18 ENCOUNTER — Encounter
Payer: BC Managed Care – PPO | Attending: Physical Medicine and Rehabilitation | Admitting: Physical Medicine and Rehabilitation

## 2020-05-18 ENCOUNTER — Other Ambulatory Visit: Payer: Self-pay

## 2020-05-18 VITALS — Ht 67.0 in | Wt 302.0 lb

## 2020-05-18 DIAGNOSIS — S93325D Dislocation of tarsometatarsal joint of left foot, subsequent encounter: Secondary | ICD-10-CM | POA: Diagnosis not present

## 2020-05-18 DIAGNOSIS — E785 Hyperlipidemia, unspecified: Secondary | ICD-10-CM | POA: Diagnosis not present

## 2020-05-18 DIAGNOSIS — I10 Essential (primary) hypertension: Secondary | ICD-10-CM

## 2020-05-18 DIAGNOSIS — G8918 Other acute postprocedural pain: Secondary | ICD-10-CM

## 2020-05-18 DIAGNOSIS — E669 Obesity, unspecified: Secondary | ICD-10-CM | POA: Diagnosis not present

## 2020-05-18 DIAGNOSIS — Z6841 Body Mass Index (BMI) 40.0 and over, adult: Secondary | ICD-10-CM | POA: Insufficient documentation

## 2020-05-18 DIAGNOSIS — S93325S Dislocation of tarsometatarsal joint of left foot, sequela: Secondary | ICD-10-CM | POA: Diagnosis not present

## 2020-05-18 DIAGNOSIS — M549 Dorsalgia, unspecified: Secondary | ICD-10-CM | POA: Insufficient documentation

## 2020-05-18 DIAGNOSIS — K5903 Drug induced constipation: Secondary | ICD-10-CM

## 2020-05-18 DIAGNOSIS — E119 Type 2 diabetes mellitus without complications: Secondary | ICD-10-CM | POA: Insufficient documentation

## 2020-05-18 DIAGNOSIS — E1165 Type 2 diabetes mellitus with hyperglycemia: Secondary | ICD-10-CM

## 2020-05-18 MED ORDER — OXYCODONE HCL 10 MG PO TABS: 10.0000 mg | ORAL_TABLET | Freq: Two times a day (BID) | ORAL | 0 refills | Status: DC | PRN
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
## 2020-05-21 NOTE — Progress Notes (Signed)
I have included it now, sorry I missed it!

## 2020-06-05 DIAGNOSIS — S93325D Dislocation of tarsometatarsal joint of left foot, subsequent encounter: Secondary | ICD-10-CM | POA: Diagnosis not present

## 2020-06-05 DIAGNOSIS — Z4789 Encounter for other orthopedic aftercare: Secondary | ICD-10-CM

## 2020-06-05 DIAGNOSIS — E119 Type 2 diabetes mellitus without complications: Secondary | ICD-10-CM | POA: Diagnosis not present

## 2020-06-05 DIAGNOSIS — E785 Hyperlipidemia, unspecified: Secondary | ICD-10-CM

## 2020-06-05 DIAGNOSIS — I10 Essential (primary) hypertension: Secondary | ICD-10-CM

## 2020-06-05 DIAGNOSIS — Z86718 Personal history of other venous thrombosis and embolism: Secondary | ICD-10-CM

## 2020-06-05 DIAGNOSIS — S2221XD Fracture of manubrium, subsequent encounter for fracture with routine healing: Secondary | ICD-10-CM

## 2020-06-05 DIAGNOSIS — S2242XD Multiple fractures of ribs, left side, subsequent encounter for fracture with routine healing: Secondary | ICD-10-CM | POA: Diagnosis not present

## 2020-06-05 DIAGNOSIS — Z6841 Body Mass Index (BMI) 40.0 and over, adult: Secondary | ICD-10-CM

## 2020-06-05 DIAGNOSIS — Z7901 Long term (current) use of anticoagulants: Secondary | ICD-10-CM

## 2020-06-05 DIAGNOSIS — K449 Diaphragmatic hernia without obstruction or gangrene: Secondary | ICD-10-CM

## 2020-06-20 ENCOUNTER — Other Ambulatory Visit: Payer: Self-pay

## 2020-06-20 ENCOUNTER — Encounter: Payer: Self-pay | Admitting: Physical Medicine and Rehabilitation

## 2020-06-20 ENCOUNTER — Encounter
Payer: BC Managed Care – PPO | Attending: Physical Medicine and Rehabilitation | Admitting: Physical Medicine and Rehabilitation

## 2020-06-20 VITALS — BP 111/78 | HR 90 | Temp 98.3°F | Ht 67.0 in | Wt 288.4 lb

## 2020-06-20 DIAGNOSIS — S93325D Dislocation of tarsometatarsal joint of left foot, subsequent encounter: Secondary | ICD-10-CM | POA: Diagnosis not present

## 2020-06-20 DIAGNOSIS — M549 Dorsalgia, unspecified: Secondary | ICD-10-CM | POA: Diagnosis not present

## 2020-06-20 DIAGNOSIS — E785 Hyperlipidemia, unspecified: Secondary | ICD-10-CM | POA: Insufficient documentation

## 2020-06-20 DIAGNOSIS — G894 Chronic pain syndrome: Secondary | ICD-10-CM | POA: Insufficient documentation

## 2020-06-20 DIAGNOSIS — Z6841 Body Mass Index (BMI) 40.0 and over, adult: Secondary | ICD-10-CM | POA: Insufficient documentation

## 2020-06-20 DIAGNOSIS — Z79891 Long term (current) use of opiate analgesic: Secondary | ICD-10-CM | POA: Diagnosis present

## 2020-06-20 DIAGNOSIS — E669 Obesity, unspecified: Secondary | ICD-10-CM | POA: Insufficient documentation

## 2020-06-20 DIAGNOSIS — E119 Type 2 diabetes mellitus without complications: Secondary | ICD-10-CM | POA: Diagnosis not present

## 2020-06-20 DIAGNOSIS — G8918 Other acute postprocedural pain: Secondary | ICD-10-CM | POA: Insufficient documentation

## 2020-06-20 DIAGNOSIS — Z5181 Encounter for therapeutic drug level monitoring: Secondary | ICD-10-CM | POA: Diagnosis not present

## 2020-06-20 DIAGNOSIS — K5903 Drug induced constipation: Secondary | ICD-10-CM | POA: Insufficient documentation

## 2020-06-20 DIAGNOSIS — I10 Essential (primary) hypertension: Secondary | ICD-10-CM | POA: Diagnosis not present
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
## 2020-06-25 LAB — DRUG TOX MONITOR 1 W/CONF, ORAL FLD

## 2020-06-25 LAB — DRUG TOX ALC METAB W/CON, ORAL FLD: Alcohol Metabolite: NEGATIVE ng/mL (ref <25)

## 2020-06-26 ENCOUNTER — Telehealth: Payer: Self-pay | Admitting: *Deleted

## 2020-06-26 NOTE — Telephone Encounter (Signed)
Oral swab drug screen was appropriately negative for oxycodone. He reports he is no longer taking the medication.

## 2020-07-23 ENCOUNTER — Other Ambulatory Visit
Admission: RE | Admit: 2020-07-23 | Discharge: 2020-07-23 | Disposition: A | Discharge status: Home / Self Care | Payer: BC Managed Care – PPO | Source: Ambulatory Visit | Attending: Orthopedic Surgery | Admitting: Orthopedic Surgery

## 2020-07-23 ENCOUNTER — Encounter (HOSPITAL_COMMUNITY): Payer: Self-pay | Admitting: Orthopedic Surgery

## 2020-07-23 ENCOUNTER — Other Ambulatory Visit: Payer: Self-pay

## 2020-07-23 DIAGNOSIS — Z01812 Encounter for preprocedural laboratory examination: Secondary | ICD-10-CM | POA: Diagnosis not present

## 2020-07-23 DIAGNOSIS — Z20822 Contact with and (suspected) exposure to covid-19: Secondary | ICD-10-CM | POA: Diagnosis not present

## 2020-07-23 LAB — SARS CORONAVIRUS 2 (TAT 6-24 HRS): SARS Coronavirus 2: NEGATIVE

## 2020-07-23 NOTE — H&P (Signed)
Orthopaedic Trauma Service (OTS) Consult   Patient ID: Manuel Fuller MRN: [id number] DOB/AGE: August 10, 1961 59 y.o.   HPI: Manuel Fuller is an 59 y.o. male who sustained a left Lisfranc fracture dislocation June 2021.  He was treated with open reduction internal fixation.  Patient has gone on to unite and presents today for removal of hardware left foot.   Past Medical History:  Diagnosis Date  . Arthritis    back  . Diabetes mellitus without complication (HCC)    Type II  . DVT (deep venous thrombosis) (HCC) 1992   right  . History of blood transfusion 1992   accident  . Hyperlipemia   . Hypertension   . Lisfranc dislocation, left, initial encounter 03/28/2020  . Umbilical hernia     Past Surgical History:  Procedure Laterality Date  . HERNIA REPAIR     umbicial  . LEG SURGERY    . OPEN REDUCTION INTERNAL FIXATION (ORIF) FOOT LISFRANC FRACTURE Left 03/27/2020   Procedure: OPEN REDUCTION INTERNAL FIXATION (ORIF) FOOT LISFRANC FRACTURE;  Surgeon: Myrene Galas, MD;  Location: [facility] OR;  Service: Orthopedics;  Laterality: Left;  . ORIF FOOT FRACTURE Left 03/27/2020    OPEN REDUCTION INTERNAL FIXATION (ORIF) FOOT LISFRANC FRACTURE (Left Foot)  . SHOULDER SURGERY      No family history on file.  Social History:  reports that he has never smoked. He has never used smokeless tobacco. He reports previous alcohol use. He reports that he does not use drugs.  Allergies: No Known Allergies  Medications: I have reviewed the patient's current medications.  No results found for this or any previous visit (from the past 48 hour(s)).  No results found.  Intake/Output    None      Review of Systems  Constitutional: Negative for chills and fever.  Respiratory: Negative for shortness of breath and wheezing.   Cardiovascular: Negative for chest pain and palpitations.  Gastrointestinal: Negative for nausea and vomiting.  Neurological: Negative for tingling and sensory  change.   Height 5\' 7"  (1.702 m), weight 131.5 kg. Physical Exam Constitutional:      General: He is not in acute distress.    Appearance: Normal appearance.  HENT:     Head: Normocephalic and atraumatic.     Mouth/Throat:     Mouth: Mucous membranes are moist.  Eyes:     Extraocular Movements: Extraocular movements intact.  Cardiovascular:     Rate and Rhythm: Normal rate and regular rhythm.  Pulmonary:     Effort: Pulmonary effort is normal.     Breath sounds: Normal breath sounds.  Musculoskeletal:     Comments: Left Lower extremity  Incisions L foot well healed No concerning findings Ext warm + DP pulse Distal motor and sensory functions intact Swelling minimal  Foot nontender + TTP over hardware to L foot  Excellent ankle, knee and hip ROM   Skin:    Capillary Refill: Capillary refill takes less than 2 seconds.  Neurological:     General: No focal deficit present.     Mental Status: He is alert and oriented to person, place, and time. Mental status is at baseline.  Psychiatric:        Mood and Affect: Mood normal.      Assessment/Plan:  59 year old male with healed left Lisfranc fracture dislocation treated with open reduction internal fixation, symptomatic hardware left foot  -Symptomatic hardware left foot s/p ORIF left Lisfranc fracture dislocation  Conservative  measures have been tried and failed.  As per standard of care hardware removal for Lisfranc fracture dislocation is necessary to prevent hardware from breaking  Patient presents today for removal of hardware  Outpatient procedure  Risks and benefits reviewed with the patient, he wishes to proceed  No restrictions postop.  He can weight-bear as tolerated postoperatively  - Pain management:  Short course of narcotics for acute postoperative pain   - Dispo:  OR for removal of hardware left foot  Follow-up with orthopedics in 10 to 14 days postoperatively for suture removal   Mearl Latin,  PA-C [phone] (C) 07/23/2020, 8:52 PM  [facility] [address] [place] [address] [phone] [phone] (F)    After 5pm and on the weekends please log on to Amion, go to orthopaedics and the look under the [facility] Call for the provider(s) on call. You can also call our office at [phone] and then follow the prompts to be connected to the call team.

## 2020-07-24 ENCOUNTER — Other Ambulatory Visit: Payer: Self-pay

## 2020-07-24 ENCOUNTER — Ambulatory Visit (HOSPITAL_COMMUNITY): Payer: BC Managed Care – PPO

## 2020-07-24 ENCOUNTER — Encounter (HOSPITAL_COMMUNITY): Payer: Self-pay | Admitting: Orthopedic Surgery

## 2020-07-24 ENCOUNTER — Ambulatory Visit (HOSPITAL_COMMUNITY)
Admission: RE | Admit: 2020-07-24 | Discharge: 2020-07-24 | Disposition: A | Discharge status: Home / Self Care | Payer: BC Managed Care – PPO | Attending: Orthopedic Surgery | Admitting: Orthopedic Surgery

## 2020-07-24 ENCOUNTER — Encounter (HOSPITAL_COMMUNITY)
Admission: RE | Disposition: A | Discharge status: Home / Self Care | Payer: Self-pay | Source: Home / Self Care | Attending: Orthopedic Surgery

## 2020-07-24 DIAGNOSIS — Z419 Encounter for procedure for purposes other than remedying health state, unspecified: Secondary | ICD-10-CM

## 2020-07-24 DIAGNOSIS — T8484XA Pain due to internal orthopedic prosthetic devices, implants and grafts, initial encounter: Secondary | ICD-10-CM | POA: Diagnosis not present

## 2020-07-24 DIAGNOSIS — Y793 Surgical instruments, materials and orthopedic devices (including sutures) associated with adverse incidents: Secondary | ICD-10-CM | POA: Diagnosis not present

## 2020-07-24 DIAGNOSIS — T84223A Displacement of internal fixation device of bones of foot and toes, initial encounter: Secondary | ICD-10-CM | POA: Diagnosis not present

## 2020-07-24 HISTORY — DX: Unspecified osteoarthritis, unspecified site: M19.90

## 2020-07-24 HISTORY — DX: Umbilical hernia without obstruction or gangrene: K42.9

## 2020-07-24 HISTORY — PX: HARDWARE REMOVAL: SHX979

## 2020-07-24 LAB — BASIC METABOLIC PANEL
Anion gap: 11 (ref 5–15)
BUN: 18 mg/dL (ref 6–20)
CO2: 23 mmol/L (ref 22–32)
Calcium: 9.1 mg/dL (ref 8.9–10.3)
Chloride: 104 mmol/L (ref 98–111)
Creatinine, Ser: 0.92 mg/dL (ref 0.61–1.24)
GFR, Estimated: >60 mL/min (ref >60)
Glucose, Bld: 141 mg/dL — ABNORMAL HIGH (ref 70–99)
Potassium: 4.1 mmol/L (ref 3.5–5.1)
Sodium: 138 mmol/L (ref 135–145)

## 2020-07-24 LAB — CBC
HCT: 41.6 % (ref 39.0–52.0)
Hemoglobin: 12.9 g/dL — ABNORMAL LOW (ref 13.0–17.0)
MCH: 27.6 pg (ref 26.0–34.0)
MCHC: 31 g/dL (ref 30.0–36.0)
MCV: 89.1 fL (ref 80.0–100.0)
Platelets: 257 10*3/uL (ref 150–400)
RBC: 4.67 MIL/uL (ref 4.22–5.81)
RDW: 14.6 % (ref 11.5–15.5)
WBC: 6.5 10*3/uL (ref 4.0–10.5)
nRBC: 0 % (ref 0.0–0.2)

## 2020-07-24 LAB — GLUCOSE, CAPILLARY
Glucose-Capillary: 142 mg/dL — ABNORMAL HIGH (ref 70–99)
Glucose-Capillary: 126 mg/dL — ABNORMAL HIGH (ref 70–99)

## 2020-07-24 SURGERY — REMOVAL, HARDWARE
Anesthesia: General | Site: Foot | Laterality: Left

## 2020-07-24 MED ORDER — KETOROLAC TROMETHAMINE 10 MG PO TABS: 10.0000 mg | ORAL_TABLET | Freq: Four times a day (QID) | ORAL | 0 refills | Status: AC | PRN

## 2020-07-24 MED ORDER — ROCURONIUM BROMIDE 10 MG/ML (PF) SYRINGE
PREFILLED_SYRINGE | INTRAVENOUS | Status: DC | PRN
  Administered 2020-07-24: 70 mg via INTRAVENOUS
  Administered 2020-07-24: 30 mg via INTRAVENOUS

## 2020-07-24 MED ORDER — ORAL CARE MOUTH RINSE: 15.0000 mL | Freq: Once | OROMUCOSAL | Status: AC

## 2020-07-24 MED ORDER — ONDANSETRON 4 MG PO TBDP: 4.0000 mg | ORAL_TABLET | Freq: Three times a day (TID) | ORAL | 0 refills | Status: AC | PRN

## 2020-07-24 MED ORDER — 0.9 % SODIUM CHLORIDE (POUR BTL) OPTIME
TOPICAL | Status: DC | PRN
  Administered 2020-07-24: 1000 mL

## 2020-07-24 MED ORDER — SUGAMMADEX SODIUM 200 MG/2ML IV SOLN
INTRAVENOUS | Status: DC | PRN
  Administered 2020-07-24: 300 mg via INTRAVENOUS

## 2020-07-24 MED ORDER — FENTANYL CITRATE (PF) 250 MCG/5ML IJ SOLN
INTRAMUSCULAR | Status: AC
  Filled 2020-07-24: qty 5

## 2020-07-24 MED ORDER — KETOROLAC TROMETHAMINE 30 MG/ML IJ SOLN
INTRAMUSCULAR | Status: DC | PRN
  Administered 2020-07-24: 30 mg via INTRAVENOUS

## 2020-07-24 MED ORDER — KETOROLAC TROMETHAMINE 10 MG PO TABS: 10.0000 mg | ORAL_TABLET | Freq: Four times a day (QID) | ORAL | 0 refills | Status: DC | PRN

## 2020-07-24 MED ORDER — ROCURONIUM BROMIDE 10 MG/ML (PF) SYRINGE
PREFILLED_SYRINGE | INTRAVENOUS | Status: AC
  Filled 2020-07-24: qty 10

## 2020-07-24 MED ORDER — HYDROMORPHONE HCL 1 MG/ML IJ SOLN
INTRAMUSCULAR | Status: AC
  Filled 2020-07-24: qty 1

## 2020-07-24 MED ORDER — OXYCODONE-ACETAMINOPHEN 5-325 MG PO TABS
1.0000 | ORAL_TABLET | Freq: Two times a day (BID) | ORAL | 0 refills | Status: AC | PRN
Start: 2020-07-24 — End: 2021-07-24

## 2020-07-24 MED ORDER — CHLORHEXIDINE GLUCONATE 0.12 % MT SOLN
15.0000 mL | Freq: Once | OROMUCOSAL | Status: AC
  Administered 2020-07-24: 15 mL via OROMUCOSAL
  Filled 2020-07-24: qty 15

## 2020-07-24 MED ORDER — DEXAMETHASONE SODIUM PHOSPHATE 10 MG/ML IJ SOLN
INTRAMUSCULAR | Status: AC
  Filled 2020-07-24: qty 1

## 2020-07-24 MED ORDER — DEXTROSE 5 % IV SOLN
3.0000 g | INTRAVENOUS | Status: AC
  Administered 2020-07-24: 3 g via INTRAVENOUS
  Filled 2020-07-24 (×3): qty 3000

## 2020-07-24 MED ORDER — PROPOFOL 10 MG/ML IV BOLUS
INTRAVENOUS | Status: DC | PRN
  Administered 2020-07-24: 200 mg via INTRAVENOUS
  Administered 2020-07-24: 50 mg via INTRAVENOUS

## 2020-07-24 MED ORDER — FENTANYL CITRATE (PF) 100 MCG/2ML IJ SOLN
INTRAMUSCULAR | Status: DC | PRN
  Administered 2020-07-24: 100 ug via INTRAVENOUS
  Administered 2020-07-24 (×3): 50 ug via INTRAVENOUS

## 2020-07-24 MED ORDER — OXYCODONE HCL 5 MG PO TABS: 5.0000 mg | ORAL_TABLET | Freq: Once | ORAL | Status: DC | PRN

## 2020-07-24 MED ORDER — ONDANSETRON HCL 4 MG/2ML IJ SOLN
INTRAMUSCULAR | Status: DC | PRN
  Administered 2020-07-24: 4 mg via INTRAVENOUS

## 2020-07-24 MED ORDER — MIDAZOLAM HCL 2 MG/2ML IJ SOLN
INTRAMUSCULAR | Status: DC | PRN
  Administered 2020-07-24: 2 mg via INTRAVENOUS

## 2020-07-24 MED ORDER — KETOROLAC TROMETHAMINE 30 MG/ML IJ SOLN
INTRAMUSCULAR | Status: AC
  Filled 2020-07-24: qty 1

## 2020-07-24 MED ORDER — ONDANSETRON HCL 4 MG/2ML IJ SOLN: 4.0000 mg | Freq: Once | INTRAMUSCULAR | Status: DC | PRN

## 2020-07-24 MED ORDER — LIDOCAINE 2% (20 MG/ML) 5 ML SYRINGE
INTRAMUSCULAR | Status: DC | PRN
  Administered 2020-07-24: 100 mg via INTRAVENOUS

## 2020-07-24 MED ORDER — OXYCODONE HCL 5 MG/5ML PO SOLN: 5.0000 mg | Freq: Once | ORAL | Status: DC | PRN

## 2020-07-24 MED ORDER — ONDANSETRON HCL 4 MG/2ML IJ SOLN
INTRAMUSCULAR | Status: AC
  Filled 2020-07-24: qty 2

## 2020-07-24 MED ORDER — MIDAZOLAM HCL 2 MG/2ML IJ SOLN
INTRAMUSCULAR | Status: AC
  Filled 2020-07-24: qty 2

## 2020-07-24 MED ORDER — SUGAMMADEX SODIUM 500 MG/5ML IV SOLN
INTRAVENOUS | Status: AC
  Filled 2020-07-24: qty 5

## 2020-07-24 MED ORDER — DEXAMETHASONE SODIUM PHOSPHATE 10 MG/ML IJ SOLN
INTRAMUSCULAR | Status: DC | PRN
  Administered 2020-07-24: 10 mg via INTRAVENOUS

## 2020-07-24 MED ORDER — LACTATED RINGERS IV SOLN: INTRAVENOUS | Status: DC

## 2020-07-24 MED ORDER — HYDROMORPHONE HCL 1 MG/ML IJ SOLN
0.2500 mg | INTRAMUSCULAR | Status: DC | PRN
  Administered 2020-07-24: 0.5 mg via INTRAVENOUS

## 2020-07-24 MED ORDER — AMISULPRIDE (ANTIEMETIC) 5 MG/2ML IV SOLN: 10.0000 mg | Freq: Once | INTRAVENOUS | Status: DC | PRN

## 2020-07-24 MED ORDER — LIDOCAINE 2% (20 MG/ML) 5 ML SYRINGE
INTRAMUSCULAR | Status: AC
  Filled 2020-07-24: qty 5

## 2020-07-24 SURGICAL SUPPLY — 63 items
BANDAGE ESMARK 6X9 LF (GAUZE/BANDAGES/DRESSINGS) IMPLANT
BNDG COHESIVE 6X5 TAN STRL LF (GAUZE/BANDAGES/DRESSINGS) IMPLANT
BNDG ELASTIC 4X5.8 VLCR STR LF (GAUZE/BANDAGES/DRESSINGS) ×3 IMPLANT
BNDG ELASTIC 6X5.8 VLCR STR LF (GAUZE/BANDAGES/DRESSINGS) IMPLANT
BNDG ESMARK 6X9 LF (GAUZE/BANDAGES/DRESSINGS)
BNDG GAUZE ELAST 4 BULKY (GAUZE/BANDAGES/DRESSINGS) IMPLANT
BRUSH SCRUB EZ PLAIN DRY (MISCELLANEOUS) ×6 IMPLANT
CLOSURE WOUND 1/2 X4 (GAUZE/BANDAGES/DRESSINGS)
COVER SURGICAL LIGHT HANDLE (MISCELLANEOUS) ×6 IMPLANT
COVER WAND RF STERILE (DRAPES) IMPLANT
CUFF TOURN SGL QUICK 18X4 (TOURNIQUET CUFF) IMPLANT
CUFF TOURN SGL QUICK 24 (TOURNIQUET CUFF)
CUFF TOURN SGL QUICK 34 (TOURNIQUET CUFF)
CUFF TRNQT CYL 24X4X16.5-23 (TOURNIQUET CUFF) IMPLANT
CUFF TRNQT CYL 34X4.125X (TOURNIQUET CUFF) IMPLANT
DRAPE C-ARM 42X72 X-RAY (DRAPES) IMPLANT
DRAPE C-ARMOR (DRAPES) ×3 IMPLANT
DRAPE U-SHAPE 47X51 STRL (DRAPES) ×3 IMPLANT
DRSG ADAPTIC 3X8 NADH LF (GAUZE/BANDAGES/DRESSINGS) IMPLANT
DRSG MEPILEX BORDER 4X4 (GAUZE/BANDAGES/DRESSINGS) ×3 IMPLANT
ELECT REM PT RETURN 9FT ADLT (ELECTROSURGICAL) ×3
ELECTRODE REM PT RTRN 9FT ADLT (ELECTROSURGICAL) ×1 IMPLANT
GAUZE SPONGE 4X4 12PLY STRL (GAUZE/BANDAGES/DRESSINGS) IMPLANT
GLOVE BIO SURGEON STRL SZ7.5 (GLOVE) ×3 IMPLANT
GLOVE BIO SURGEON STRL SZ8 (GLOVE) ×3 IMPLANT
GLOVE BIOGEL PI IND STRL 7.5 (GLOVE) ×1 IMPLANT
GLOVE BIOGEL PI IND STRL 8 (GLOVE) ×1 IMPLANT
GLOVE BIOGEL PI INDICATOR 7.5 (GLOVE) ×2
GLOVE BIOGEL PI INDICATOR 8 (GLOVE) ×2
GOWN STRL REUS W/ TWL LRG LVL3 (GOWN DISPOSABLE) ×2 IMPLANT
GOWN STRL REUS W/ TWL XL LVL3 (GOWN DISPOSABLE) ×1 IMPLANT
GOWN STRL REUS W/TWL LRG LVL3 (GOWN DISPOSABLE) ×4
GOWN STRL REUS W/TWL XL LVL3 (GOWN DISPOSABLE) ×2
KIT BASIN OR (CUSTOM PROCEDURE TRAY) ×3 IMPLANT
KIT TURNOVER KIT B (KITS) ×3 IMPLANT
MANIFOLD NEPTUNE II (INSTRUMENTS) IMPLANT
NEEDLE 22X1 1/2 (OR ONLY) (NEEDLE) IMPLANT
NS IRRIG 1000ML POUR BTL (IV SOLUTION) ×3 IMPLANT
PACK ORTHO EXTREMITY (CUSTOM PROCEDURE TRAY) ×3 IMPLANT
PAD ARMBOARD 7.5X6 YLW CONV (MISCELLANEOUS) ×6 IMPLANT
PAD CAST 4YDX4 CTTN HI CHSV (CAST SUPPLIES) ×1 IMPLANT
PADDING CAST COTTON 4X4 STRL (CAST SUPPLIES) ×2
PADDING CAST COTTON 6X4 STRL (CAST SUPPLIES) IMPLANT
SPONGE LAP 18X18 RF (DISPOSABLE) IMPLANT
STAPLER VISISTAT 35W (STAPLE) IMPLANT
STOCKINETTE IMPERVIOUS LG (DRAPES) IMPLANT
STRIP CLOSURE SKIN 1/2X4 (GAUZE/BANDAGES/DRESSINGS) IMPLANT
SUCTION FRAZIER HANDLE 10FR (MISCELLANEOUS)
SUCTION TUBE FRAZIER 10FR DISP (MISCELLANEOUS) IMPLANT
SUT ETHILON 3 0 PS 1 (SUTURE) IMPLANT
SUT PDS AB 2-0 CT1 27 (SUTURE) IMPLANT
SUT VIC AB 0 CT1 27 (SUTURE)
SUT VIC AB 0 CT1 27XBRD ANBCTR (SUTURE) IMPLANT
SUT VIC AB 2-0 CT1 27 (SUTURE)
SUT VIC AB 2-0 CT1 TAPERPNT 27 (SUTURE) IMPLANT
SYR CONTROL 10ML LL (SYRINGE) IMPLANT
TOWEL GREEN STERILE (TOWEL DISPOSABLE) ×6 IMPLANT
TOWEL GREEN STERILE FF (TOWEL DISPOSABLE) ×6 IMPLANT
TUBE CONNECTING 12'X1/4 (SUCTIONS) ×1
TUBE CONNECTING 12X1/4 (SUCTIONS) ×2 IMPLANT
UNDERPAD 30X36 HEAVY ABSORB (UNDERPADS AND DIAPERS) ×6 IMPLANT
WATER STERILE IRR 1000ML POUR (IV SOLUTION) IMPLANT
YANKAUER SUCT BULB TIP NO VENT (SUCTIONS) ×3 IMPLANT

## 2020-07-24 NOTE — Discharge Instructions (Addendum)
Orthopaedic Trauma Service Discharge Instructions   General Discharge Instructions   WEIGHT BEARING STATUS: Weightbearing as tolerated left leg   RANGE OF MOTION/ACTIVITY:range of motion as tolerated left ankle and foot. Increase activity slowly   Wound Care: daily wound care starting on 07/27/2020. See below    Discharge Wound Care Instructions  Do NOT apply any ointments, solutions or lotions to pin sites or surgical wounds.  These prevent needed drainage and even though solutions like hydrogen peroxide kill bacteria, they also damage cells lining the pin sites that help fight infection.  Applying lotions or ointments can keep the wounds moist and can cause them to breakdown and open up as well. This can increase the risk for infection. When in doubt call the office.  Surgical incisions should be dressed daily.  If any drainage is noted, use one layer of adaptic, then gauze, Kerlix, and an ace wrap.  Once the incision is completely dry and without drainage, it may be left open to air out.  Showering may begin 36-48 hours later.  Cleaning gently with soap and water.  Diet: as you were eating previously.  Can use over the counter stool softeners and bowel preparations, such as Miralax, to help with bowel movements.  Narcotics can be constipating.  Be sure to drink plenty of fluids  PAIN MEDICATION USE AND EXPECTATIONS  You have likely been given narcotic medications to help control your pain.  After a traumatic event that results in an fracture (broken bone) with or without surgery, it is ok to use narcotic pain medications to help control one's pain.  We understand that everyone responds to pain differently and each individual patient will be evaluated on a regular basis for the continued need for narcotic medications. Ideally, narcotic medication use should last no more than 6-8 weeks (coinciding with fracture healing).   As a patient it is your responsibility as well to monitor  narcotic medication use and report the amount and frequency you use these medications when you come to your office visit.   We would also advise that if you are using narcotic medications, you should take a dose prior to therapy to maximize you participation.  IF YOU ARE ON NARCOTIC MEDICATIONS IT IS NOT PERMISSIBLE TO OPERATE A MOTOR VEHICLE (MOTORCYCLE/CAR/TRUCK/MOPED) OR HEAVY MACHINERY DO NOT MIX NARCOTICS WITH OTHER CNS (CENTRAL NERVOUS SYSTEM) DEPRESSANTS SUCH AS ALCOHOL   STOP SMOKING OR USING NICOTINE PRODUCTS!!!!  As discussed nicotine severely impairs your body's ability to heal surgical and traumatic wounds but also impairs bone healing.  Wounds and bone heal by forming microscopic blood vessels (angiogenesis) and nicotine is a vasoconstrictor (essentially, shrinks blood vessels).  Therefore, if vasoconstriction occurs to these microscopic blood vessels they essentially disappear and are unable to deliver necessary nutrients to the healing tissue.  This is one modifiable factor that you can do to dramatically increase your chances of healing your injury.    (This means no smoking, no nicotine gum, patches, etc)      ICE AND ELEVATE INJURED/OPERATIVE EXTREMITY  Using ice and elevating the injured extremity above your heart can help with swelling and pain control.  Icing in a pulsatile fashion, such as 20 minutes on and 20 minutes off, can be followed.    Do not place ice directly on skin. Make sure there is a barrier between to skin and the ice pack.    Using frozen items such as frozen peas works well as the conform nicely to the  are that needs to be iced.  USE AN ACE WRAP OR TED HOSE FOR SWELLING CONTROL  In addition to icing and elevation, Ace wraps or TED hose are used to help limit and resolve swelling.  It is recommended to use Ace wraps or TED hose until you are informed to stop.    When using Ace Wraps start the wrapping distally (farthest away from the body) and wrap proximally  (closer to the body)   Example: If you had surgery on your leg or thing and you do not have a splint on, start the ace wrap at the toes and work your way up to the thigh        If you had surgery on your upper extremity and do not have a splint on, start the ace wrap at your fingers and work your way up to the upper arm  IF YOU ARE IN A SPLINT OR CAST DO NOT REMOVE IT FOR ANY REASON   If your splint gets wet for any reason please contact the office immediately. You may shower in your splint or cast as long as you keep it dry.  This can be done by wrapping in a cast cover or garbage back (or similar)  Do Not stick any thing down your splint or cast such as pencils, money, or hangers to try and scratch yourself with.  If you feel itchy take benadryl as prescribed on the bottle for itching  IF YOU ARE IN A CAM BOOT (BLACK BOOT)  You may remove boot periodically. Perform daily dressing changes as noted below.  Wash the liner of the boot regularly and wear a sock when wearing the boot. It is recommended that you sleep in the boot until told otherwise    Call office for the following:  Temperature greater than 101F  Persistent nausea and vomiting  Severe uncontrolled pain  Redness, tenderness, or signs of infection (pain, swelling, redness, odor or green/yellow discharge around the site)  Difficulty breathing, headache or visual disturbances  Hives  Persistent dizziness or light-headedness  Extreme fatigue  Any other questions or concerns you may have after discharge  In an emergency, call 911 or go to an Emergency Department at a nearby hospital  HELPFUL INFORMATION  ? If you had a block, it will wear off between 8-24 hrs postop typically.  This is period when your pain may go from nearly zero to the pain you would have had postop without the block.  This is an abrupt transition but nothing dangerous is happening.  You may take an extra dose of narcotic when this happens.  ? You  should wean off your narcotic medicines as soon as you are able.  Most patients will be off or using minimal narcotics before their first postop appointment.   ? We suggest you use the pain medication the first night prior to going to bed, in order to ease any pain when the anesthesia wears off. You should avoid taking pain medications on an empty stomach as it will make you nauseous.  ? Do not drink alcoholic beverages or take illicit drugs when taking pain medications.  ? In most states it is against the law to drive while you are in a splint or sling.  And certainly against the law to drive while taking narcotics.  ? You may return to work/school in the next couple of days when you feel up to it.   ? Pain medication may make you constipated.  Below are a few solutions to try in this order: - Decrease the amount of pain medication if you aren't having pain. - Drink lots of decaffeinated fluids. - Drink prune juice and/or each dried prunes  o If the first 3 don't work start with additional solutions - Take Colace - an over-the-counter stool softener - Take Senokot - an over-the-counter laxative - Take Miralax - a stronger over-the-counter laxative     CALL THE OFFICE WITH ANY QUESTIONS OR CONCERNS: [phone]   VISIT OUR WEBSITE FOR ADDITIONAL INFORMATION: [email or web address]

## 2020-07-24 NOTE — Anesthesia Preprocedure Evaluation (Addendum)
Anesthesia Evaluation  Patient identified by MRN, date of birth, ID band Patient awake    Reviewed: Allergy & Precautions, NPO status , Patient's Chart, lab work & pertinent test results  History of Anesthesia Complications Negative for: history of anesthetic complications  Airway Mallampati: II  TM Distance: >3 FB Neck ROM: Full   Comment: Beard, large neck Dental  (+) Teeth Intact   Pulmonary neg pulmonary ROS,    Pulmonary exam normal        Cardiovascular hypertension, + DVT (1992)  Normal cardiovascular exam     Neuro/Psych negative neurological ROS  negative psych ROS   GI/Hepatic negative GI ROS, Neg liver ROS,   Endo/Other  diabetes, Type obesity  Renal/GU negative Renal ROS  negative genitourinary   Musculoskeletal  (+) Arthritis ,   Abdominal   Peds  Hematology negative hematology ROS (+)   Anesthesia Other Findings   Reproductive/Obstetrics                           Anesthesia Physical Anesthesia Plan  ASA: III  Anesthesia Plan: General   Post-op Pain Management:    Induction: Intravenous  PONV Risk Score and Plan: 2 and Ondansetron, Dexamethasone, Treatment may vary due to age or medical condition and Midazolam  Airway Management Planned: Oral ETT  Additional Equipment: None  Intra-op Plan:   Post-operative Plan: Extubation in OR  Informed Consent: I have reviewed the patients History and Physical, chart, labs and discussed the procedure including the risks, benefits and alternatives for the proposed anesthesia with the patient or authorized representative who has indicated his/her understanding and acceptance.     Dental advisory given  Plan Discussed with:   Anesthesia Plan Comments:        Anesthesia Quick Evaluation

## 2020-07-24 NOTE — Transfer of Care (Signed)
Immediate Anesthesia Transfer of Care Note  Patient: Manuel Fuller  Procedure(s) Performed: HARDWARE REMOVAL (Left Foot)  Patient Location: PACU  Anesthesia Type:General  Level of Consciousness: awake, alert  and oriented  Airway & Oxygen Therapy: Patient Spontanous Breathing and Patient connected to face mask oxygen  Post-op Assessment: Report given to RN and Post -op Vital signs reviewed and stable  Post vital signs: Reviewed and stable  Last Vitals:  Vitals Value Taken Time  BP 128/74   Temp    Pulse 74 07/24/20 0935  Resp 16 07/24/20 0935  SpO2 100 % 07/24/20 0935  Vitals shown include unvalidated device data.  Last Pain:  Vitals:   07/24/20 0258  TempSrc:   PainSc: 0-No pain         Complications: No complications documented.

## 2020-07-24 NOTE — Anesthesia Procedure Notes (Signed)
Procedure Name: Intubation Date/Time: 07/24/2020 8:21 AM Performed by: Pearson Grippe, CRNA Pre-anesthesia Checklist: Patient identified, Emergency Drugs available, Suction available and Patient being monitored Patient Re-evaluated:Patient Re-evaluated prior to induction Oxygen Delivery Method: Circle system utilized Preoxygenation: Pre-oxygenation with 100% oxygen Induction Type: IV induction Ventilation: Mask ventilation with difficulty, Oral airway inserted - appropriate to patient size and Two handed mask ventilation required Laryngoscope Size: Miller and 2 Grade View: Grade I Tube type: Oral Tube size: 7.5 mm Number of attempts: 1 Airway Equipment and Method: Stylet and Oral airway Placement Confirmation: ETT inserted through vocal cords under direct vision,  positive ETCO2 and breath sounds checked- equal and bilateral Secured at: 22 cm Tube secured with: Tape Dental Injury: Teeth and Oropharynx as per pre-operative assessment

## 2020-07-24 NOTE — Anesthesia Postprocedure Evaluation (Signed)
Anesthesia Post Note  Patient: Manuel Fuller  Procedure(s) Performed: HARDWARE REMOVAL (Left Foot)     Patient location during evaluation: PACU Anesthesia Type: General Level of consciousness: awake and alert Pain management: pain level controlled Vital Signs Assessment: post-procedure vital signs reviewed and stable Respiratory status: spontaneous breathing, nonlabored ventilation and respiratory function stable Cardiovascular status: blood pressure returned to baseline and stable Postop Assessment: no apparent nausea or vomiting Anesthetic complications: no   No complications documented.  Last Vitals:  Vitals:   07/24/20 1019 07/24/20 1032  BP: (!) 145/87 (!) 141/90  Pulse: 72 73  Resp: (!) 21 17  Temp:  36.8 C  SpO2: 96% 96%    Last Pain:  Vitals:   07/24/20 1033  TempSrc:   PainSc: 0-No pain                 [name]

## 2020-07-24 NOTE — Op Note (Signed)
07/24/2020  10:30 AM  PATIENT:  Manuel Fuller  59 y.o. male  PRE-OPERATIVE DIAGNOSIS:  SYMPTOMATIC HARDWARE LEFT FOOT  POST-OPERATIVE DIAGNOSIS:  SYMPTOMATIC HARDWARE LEFT FOOT  PROCEDURE:  Procedure(s): HARDWARE REMOVAL (Left) Deep Implant   SURGEON:  Surgeon(s) and Role:    Carola Frost, Casimiro Needle, MD - Primary  PHYSICIAN ASSISTANT: PA Student  ANESTHESIA:   general  I/O:  Total I/O In: 800 [I.V.:800] Out: 10 [Blood:10]  SPECIMEN:  No Specimen  TOURNIQUET:  * No tourniquets in log *  COMPLICATIONS: NONE  DICTATION: .Note written in EPIC  DISPOSITION: TO PACU  CONDITION: STABLE  DELAY START OF DVT PROPHYLAXIS BECAUSE OF BLEEDING RISK: NO  BRIEF SUMMARY OF INDICATION FOR PROCEDURE:  Patient is a pleasant 59 y.o. who underwent plate fixation of a fracture with subsequent healing but then fracture of the plate and loosening because it was a joint bridging construct. Hardware related pain has persisted following breakage. Therefore, I discussed with the patient the risks and benefits of surgical removal including infection, nerve or vessel injury, failure to alleviate symptoms, occult nonunion, re-fracture, DVT, PE, and multiple others. He did wish to proceed.  BRIEF SUMMARY OF PROCEDURE:  The patient was taken to the operating room after administration of 3 g of Ancef.  General anesthesia was induced. The left lower extremity was prepped and draped in usual sterile fashion.  No tourniquet was used during the procedure.  C-arm was brought in to confirm position of the hardware.  I remade the old distal incision and dissected sharply down to the plate, elevating the soft tissues. I identified and removed all screws except for one with a broken head, leaving the deep portion which was completely intra-osseous behind. The plate was extracted atraumatically. Final x-rays confirmed removal of all hardware except the one broken screw and a healed fracture. The wounds were irrigated  thoroughly and closed in standard fashion with vicryl and nylon. A sterile gently compressive dressing was applied.  The patient was taken to the PACU in stable condition. A PA student assisted me throughout.  PROGNOSIS: Patient will be weightbearing as tolerated with aggressive active and passive motion of the knee and ankle. Bleeding would be anticipated. He may change or remove his dressing in 48 hours and shower. Patient will follow up in 10 days for removal of sutures.    Doralee Albino. Carola Frost, M.D.

## 2020-07-25 ENCOUNTER — Encounter (HOSPITAL_COMMUNITY): Payer: Self-pay | Admitting: Orthopedic Surgery
